# Patient Record
Sex: Female | Born: 1985 | Race: Black or African American | Hispanic: No | Marital: Married | Smoking: Never smoker
Health system: Southern US, Community
[De-identification: ages and names within clinical notes are randomized; demographics above are authoritative.]

## PROBLEM LIST (undated history)

## (undated) DIAGNOSIS — O139 Gestational [pregnancy-induced] hypertension without significant proteinuria, unspecified trimester: Secondary | ICD-10-CM

## (undated) DIAGNOSIS — Z789 Other specified health status: Secondary | ICD-10-CM

## (undated) DIAGNOSIS — N979 Female infertility, unspecified: Secondary | ICD-10-CM

## (undated) HISTORY — PX: NO PAST SURGERIES: SHX2092

## (undated) HISTORY — DX: Gestational (pregnancy-induced) hypertension without significant proteinuria, unspecified trimester: O13.9

---

## 2019-04-09 LAB — OB RESULTS CONSOLE ABO/RH: RH Type: NEGATIVE

## 2019-04-09 LAB — OB RESULTS CONSOLE RUBELLA ANTIBODY, IGM: Rubella: IMMUNE

## 2019-04-09 LAB — OB RESULTS CONSOLE HEPATITIS B SURFACE ANTIGEN: Hepatitis B Surface Ag: NEGATIVE

## 2019-04-09 LAB — OB RESULTS CONSOLE GC/CHLAMYDIA
Chlamydia: NEGATIVE
Gonorrhea: NEGATIVE

## 2019-04-09 LAB — OB RESULTS CONSOLE ANTIBODY SCREEN: Antibody Screen: NEGATIVE

## 2019-04-09 LAB — OB RESULTS CONSOLE RPR: RPR: NONREACTIVE

## 2019-04-09 LAB — OB RESULTS CONSOLE HIV ANTIBODY (ROUTINE TESTING): HIV: NONREACTIVE

## 2019-04-11 NOTE — L&D Delivery Note (Signed)
Delivery Note At 3:15 AM a viable and healthy female was delivered via Vaginal, Spontaneous (Presentation:    Right Occiput Anterior).  APGAR: 2, 9; weight pending .   Placenta status: retained  Cord: 3 vessels with avulsion of the umbilical cord from the body of the placenta with subsequent retained placenta  With excellent maternal expulsive effort the patient brought to the fetal head to crowning.  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  Initially, and midline episiotomy was performed to create additional room for delivery of the head.  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  With this the fetal head delivered.  At the time of delivery of the infant's head a tight nuchal cord was noted and could not be reduced on the perineum.  The umbilical cord was cut and clamped on the perineum and then the infant was delivered and passed to the waiting maternal abdomen.  Immediately after delivery the infant appeared stunned with initial initial heart rate of 40.  Vigorous stimulation was performed.  The infant was quickly passed to the [facility] for additional resuscitation.  Code Apgar was called.  Due to a malfunction in the emergency call system the NICU team was not initially alerted of the need to attend the delivery.  Therefore, the NICU team was called directly.  Please see the NICU note for complete details of the infant's resuscitation.  Gentle traction was placed on the umbilical cord and uterine massage was performed to assist with release of the placenta from the uterus.  However, during this process the umbilical cord avulsed from the body of the placenta.  Continued attempts were made in the delivery room to deliver the placenta, however it became apparent that placental delivery could not be achieved in the delivery room and required transfer to the operating room.  Please see the operative report  for retained placenta for details of this portion of the procedure.  The baby remained in the delivery room with the father and the patient was transferred to the operating room for suction D&C  Anesthesia: Epidural Episiotomy: Median Lacerations: 2nd degree  Est. Blood Loss (mL): 500  Mom to OR.  Baby to Nursery.  Waynard Reeds 10/16/2019, 5:17 AM

## 2019-10-07 LAB — OB RESULTS CONSOLE GBS: GBS: NEGATIVE

## 2019-10-08 ENCOUNTER — Inpatient Hospital Stay (HOSPITAL_COMMUNITY)
Admission: AD | Admit: 2019-10-08 | Discharge: 2019-10-08 | Disposition: A | Discharge status: Home / Self Care | Payer: Medicaid Other | Attending: Obstetrics and Gynecology | Admitting: Obstetrics and Gynecology

## 2019-10-08 ENCOUNTER — Other Ambulatory Visit: Payer: Self-pay

## 2019-10-08 ENCOUNTER — Encounter (HOSPITAL_COMMUNITY): Payer: Self-pay | Admitting: Obstetrics and Gynecology

## 2019-10-08 DIAGNOSIS — Z3A36 36 weeks gestation of pregnancy: Secondary | ICD-10-CM

## 2019-10-08 DIAGNOSIS — O133 Gestational [pregnancy-induced] hypertension without significant proteinuria, third trimester: Secondary | ICD-10-CM | POA: Diagnosis not present

## 2019-10-08 DIAGNOSIS — Z3689 Encounter for other specified antenatal screening: Secondary | ICD-10-CM

## 2019-10-08 HISTORY — DX: Other specified health status: Z78.9

## 2019-10-08 LAB — COMPREHENSIVE METABOLIC PANEL
ALT: 13 U/L (ref 0–44)
AST: 20 U/L (ref 15–41)
Albumin: 2.5 g/dL — ABNORMAL LOW (ref 3.5–5.0)
Alkaline Phosphatase: 124 U/L (ref 38–126)
Anion gap: 9 (ref 5–15)
BUN: 5 mg/dL — ABNORMAL LOW (ref 6–20)
CO2: 19 mmol/L — ABNORMAL LOW (ref 22–32)
Calcium: 9.2 mg/dL (ref 8.9–10.3)
Chloride: 109 mmol/L (ref 98–111)
Creatinine, Ser: 0.75 mg/dL (ref 0.44–1.00)
GFR calc Af Amer: >60 mL/min (ref >60)
GFR calc non Af Amer: >60 mL/min (ref >60)
Glucose, Bld: 130 mg/dL — ABNORMAL HIGH (ref 70–99)
Potassium: 3.7 mmol/L (ref 3.5–5.1)
Sodium: 137 mmol/L (ref 135–145)
Total Bilirubin: 0.4 mg/dL (ref 0.3–1.2)
Total Protein: 6.1 g/dL — ABNORMAL LOW (ref 6.5–8.1)

## 2019-10-08 LAB — URINALYSIS, ROUTINE W REFLEX MICROSCOPIC
Bilirubin Urine: NEGATIVE
Glucose, UA: NEGATIVE mg/dL
Ketones, ur: NEGATIVE mg/dL
Leukocytes,Ua: NEGATIVE
Nitrite: NEGATIVE
Protein, ur: 100 mg/dL — AB
Specific Gravity, Urine: 1.019 (ref 1.005–1.030)
pH: 6 (ref 5.0–8.0)

## 2019-10-08 LAB — CBC
HCT: 33.3 % — ABNORMAL LOW (ref 36.0–46.0)
Hemoglobin: 10.9 g/dL — ABNORMAL LOW (ref 12.0–15.0)
MCH: 27.3 pg (ref 26.0–34.0)
MCHC: 32.7 g/dL (ref 30.0–36.0)
MCV: 83.3 fL (ref 80.0–100.0)
Platelets: 250 10*3/uL (ref 150–400)
RBC: 4 MIL/uL (ref 3.87–5.11)
RDW: 14 % (ref 11.5–15.5)
WBC: 9.9 10*3/uL (ref 4.0–10.5)
nRBC: 0 % (ref 0.0–0.2)

## 2019-10-08 LAB — PROTEIN / CREATININE RATIO, URINE
Creatinine, Urine: 250.27 mg/dL
Protein Creatinine Ratio: 0.25 mg/mg{Cre} — ABNORMAL HIGH (ref 0.00–0.15)
Total Protein, Urine: 62 mg/dL

## 2019-10-08 NOTE — MAU Note (Signed)
Sent here from office for follow up with BP

## 2019-10-08 NOTE — Discharge Instructions (Signed)

## 2019-10-08 NOTE — MAU Provider Note (Signed)
History     CSN: [id number]  Arrival date and time: 10/08/19 1414   First Provider Initiated Contact with Patient 10/08/19 1502      Chief Complaint  Patient presents with  . Hypertension   34 y.o. G1 @36 .2 wks presenting with elevated BP. Pt reports elevated BP yesterday and again today at the office. She went home and checked her BP and was 160/110. Denies HA, visual disturbances, RUQ pain, SOB, and CP. Reports good FM. No c/o.    OB History    Gravida  1   Para      Term      Preterm      AB      Living        SAB      TAB      Ectopic      Multiple      Live Births              Past Medical History:  Diagnosis Date  . Medical history non-contributory     Past Surgical History:  Procedure Laterality Date  . NO PAST SURGERIES      History reviewed. No pertinent family history.  Social History   Tobacco Use  . Smoking status: Not on file  Vaping Use  . Vaping Use: Never used  Substance Use Topics  . Alcohol use: Never  . Drug use: Never    Allergies: Not on File  Medications Prior to Admission  Medication Sig Dispense Refill Last Dose  . Prenatal Vit-Fe Fumarate-FA (PRENATAL MULTIVITAMIN) TABS tablet Take 1 tablet by mouth daily at 12 noon.   10/08/2019 at Unknown time    Review of Systems  Eyes: Negative for visual disturbance.  Gastrointestinal: Negative for abdominal pain.  Neurological: Negative for headaches.   Physical Exam   Blood pressure (!) 129/95, pulse 79, temperature 98.2 F (36.8 C), temperature source Oral, resp. rate 18, height 5\' 6"  (1.676 m), SpO2 100 %. Patient Vitals for the past 24 hrs:  BP Temp Temp src Pulse Resp SpO2 Height  10/08/19 1711 (!) 129/95 98.2 F (36.8 C) Oral 79 18 -- --  10/08/19 1701 (!) 129/95 -- -- 98 -- -- --  10/08/19 1646 (!) 149/108 -- -- 89 -- -- --  10/08/19 1631 138/89 -- -- 77 -- -- --  10/08/19 1616 140/85 -- -- 78 -- -- --  10/08/19 1601 (!) 142/87 -- -- 90 -- -- --   10/08/19 1546 132/86 -- -- 88 -- -- --  10/08/19 1531 (!) 148/94 -- -- 87 -- -- --  10/08/19 1516 (!) 141/91 -- -- 92 -- -- --  10/08/19 1500 (!) 148/93 -- -- 93 -- -- --  10/08/19 1447 (!) 151/96 -- -- 95 -- -- --  10/08/19 1443 -- 98.2 F (36.8 C) Oral 97 19 100 % 5\' 6"  (1.676 m)   Physical Exam Vitals and nursing note reviewed. Exam conducted with a chaperone present.  Constitutional:      General: She is not in acute distress.    Appearance: Normal appearance.  HENT:     Head: Normocephalic and atraumatic.  Pulmonary:     Effort: Pulmonary effort is normal. No respiratory distress.  Musculoskeletal:     Cervical back: Normal range of motion.  Neurological:     General: No focal deficit present.     Mental Status: She is alert and oriented to person, place, and time.  Psychiatric:  Mood and Affect: Mood is anxious.   EFM: 155 bpm, mod variability, + accels, no decels Toco: none  Results for orders placed or performed during the hospital encounter of 10/08/19 (from the past 24 hour(s))  Urinalysis, Routine w reflex microscopic     Status: Abnormal   Collection Time: 10/08/19  3:00 PM  Result Value Ref Range   Color, Urine YELLOW YELLOW   APPearance HAZY (A) CLEAR   Specific Gravity, Urine 1.019 1.005 - 1.030   pH 6.0 5.0 - 8.0   Glucose, UA NEGATIVE NEGATIVE mg/dL   Hgb urine dipstick SMALL (A) NEGATIVE   Bilirubin Urine NEGATIVE NEGATIVE   Ketones, ur NEGATIVE NEGATIVE mg/dL   Protein, ur 282 (A) NEGATIVE mg/dL   Nitrite NEGATIVE NEGATIVE   Leukocytes,Ua NEGATIVE NEGATIVE   RBC / HPF 0-5 0 - 5 RBC/hpf   WBC, UA 0-5 0 - 5 WBC/hpf   Bacteria, UA FEW (A) NONE SEEN   Squamous Epithelial / LPF 0-5 0 - 5   Mucus PRESENT   Protein / creatinine ratio, urine     Status: Abnormal   Collection Time: 10/08/19  3:01 PM  Result Value Ref Range   Creatinine, Urine 250.27 mg/dL   Total Protein, Urine 62 mg/dL   Protein Creatinine Ratio 0.25 (H) 0.00 - 0.15 mg/mg[Cre]   CBC     Status: Abnormal   Collection Time: 10/08/19  3:07 PM  Result Value Ref Range   WBC 9.9 4.0 - 10.5 K/uL   RBC 4.00 3.87 - 5.11 MIL/uL   Hemoglobin 10.9 (L) 12.0 - 15.0 g/dL   HCT 06.0 (L) 36 - 46 %   MCV 83.3 80.0 - 100.0 fL   MCH 27.3 26.0 - 34.0 pg   MCHC 32.7 30.0 - 36.0 g/dL   RDW 15.6 15.3 - 79.4 %   Platelets 250 150 - 400 K/uL   nRBC 0.0 0.0 - 0.2 %  Comprehensive metabolic panel     Status: Abnormal   Collection Time: 10/08/19  3:07 PM  Result Value Ref Range   Sodium 137 135 - 145 mmol/L   Potassium 3.7 3.5 - 5.1 mmol/L   Chloride 109 98 - 111 mmol/L   CO2 19 (L) 22 - 32 mmol/L   Glucose, Bld 130 (H) 70 - 99 mg/dL   BUN 5 (L) 6 - 20 mg/dL   Creatinine, Ser 3.27 0.44 - 1.00 mg/dL   Calcium 9.2 8.9 - 61.4 mg/dL   Total Protein 6.1 (L) 6.5 - 8.1 g/dL   Albumin 2.5 (L) 3.5 - 5.0 g/dL   AST 20 15 - 41 U/L   ALT 13 0 - 44 U/L   Alkaline Phosphatase 124 38 - 126 U/L   Total Bilirubin 0.4 0.3 - 1.2 mg/dL   GFR calc non Af Amer >60 >60 mL/min   GFR calc Af Amer >60 >60 mL/min   Anion gap 9 5 - 15   MAU Course  Procedures  MDM Labs ordered and reviewed. PN records requested and received. 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 No evidence of PEC. Pt reports f/u appt in office in 2 days. 1640: Discussed presentation and clinical findings with Dr. Claiborne Billings and she confirms office f/u in 2 days. Pt is stable for discharge home.   Assessment and Plan   1. [redacted] weeks gestation of pregnancy   2. NST (non-stress test) reactive   3. Gestational hypertension, third trimester    Discharge home  Follow up at [facility] in 2 days Strict PEC precautions  Allergies as of 10/08/2019   Not on File     Medication List    TAKE these medications   prenatal multivitamin Tabs tablet Take 1 tablet by mouth daily at 12 noon.      Donette Larry, CNM 10/08/2019, 5:16 PM

## 2019-10-08 NOTE — MAU Note (Signed)
Office called and requested prenatal record to be sent

## 2019-10-09 ENCOUNTER — Other Ambulatory Visit: Payer: Self-pay | Admitting: Obstetrics & Gynecology

## 2019-10-10 ENCOUNTER — Other Ambulatory Visit: Payer: Self-pay

## 2019-10-10 ENCOUNTER — Encounter (HOSPITAL_COMMUNITY): Payer: Self-pay | Admitting: Obstetrics

## 2019-10-10 ENCOUNTER — Inpatient Hospital Stay (HOSPITAL_COMMUNITY)
Admission: AD | Admit: 2019-10-10 | Discharge: 2019-10-10 | Disposition: A | Discharge status: Home / Self Care | Payer: Medicaid Other | Source: Ambulatory Visit | Attending: Obstetrics | Admitting: Obstetrics

## 2019-10-10 ENCOUNTER — Telehealth (HOSPITAL_COMMUNITY): Payer: Self-pay | Admitting: *Deleted

## 2019-10-10 DIAGNOSIS — Z3A36 36 weeks gestation of pregnancy: Secondary | ICD-10-CM | POA: Diagnosis not present

## 2019-10-10 DIAGNOSIS — O99213 Obesity complicating pregnancy, third trimester: Secondary | ICD-10-CM | POA: Diagnosis not present

## 2019-10-10 DIAGNOSIS — O133 Gestational [pregnancy-induced] hypertension without significant proteinuria, third trimester: Secondary | ICD-10-CM

## 2019-10-10 DIAGNOSIS — E669 Obesity, unspecified: Secondary | ICD-10-CM | POA: Diagnosis not present

## 2019-10-10 DIAGNOSIS — Z79899 Other long term (current) drug therapy: Secondary | ICD-10-CM | POA: Diagnosis not present

## 2019-10-10 DIAGNOSIS — Z7982 Long term (current) use of aspirin: Secondary | ICD-10-CM | POA: Diagnosis not present

## 2019-10-10 LAB — COMPREHENSIVE METABOLIC PANEL
ALT: 13 U/L (ref 0–44)
AST: 19 U/L (ref 15–41)
Albumin: 2.4 g/dL — ABNORMAL LOW (ref 3.5–5.0)
Alkaline Phosphatase: 131 U/L — ABNORMAL HIGH (ref 38–126)
Anion gap: 8 (ref 5–15)
BUN: 5 mg/dL — ABNORMAL LOW (ref 6–20)
CO2: 20 mmol/L — ABNORMAL LOW (ref 22–32)
Calcium: 9 mg/dL (ref 8.9–10.3)
Chloride: 109 mmol/L (ref 98–111)
Creatinine, Ser: 0.65 mg/dL (ref 0.44–1.00)
GFR calc Af Amer: >60 mL/min (ref >60)
GFR calc non Af Amer: >60 mL/min (ref >60)
Glucose, Bld: 128 mg/dL — ABNORMAL HIGH (ref 70–99)
Potassium: 3.6 mmol/L (ref 3.5–5.1)
Sodium: 137 mmol/L (ref 135–145)
Total Bilirubin: 0.5 mg/dL (ref 0.3–1.2)
Total Protein: 6 g/dL — ABNORMAL LOW (ref 6.5–8.1)

## 2019-10-10 LAB — URINALYSIS, ROUTINE W REFLEX MICROSCOPIC
Bilirubin Urine: NEGATIVE
Glucose, UA: NEGATIVE mg/dL
Hgb urine dipstick: NEGATIVE
Ketones, ur: NEGATIVE mg/dL
Leukocytes,Ua: NEGATIVE
Nitrite: NEGATIVE
Protein, ur: 100 mg/dL — AB
Specific Gravity, Urine: 1.023 (ref 1.005–1.030)
pH: 6 (ref 5.0–8.0)

## 2019-10-10 LAB — CBC
HCT: 33.8 % — ABNORMAL LOW (ref 36.0–46.0)
Hemoglobin: 10.8 g/dL — ABNORMAL LOW (ref 12.0–15.0)
MCH: 26.8 pg (ref 26.0–34.0)
MCHC: 32 g/dL (ref 30.0–36.0)
MCV: 83.9 fL (ref 80.0–100.0)
Platelets: 240 10*3/uL (ref 150–400)
RBC: 4.03 MIL/uL (ref 3.87–5.11)
RDW: 14.2 % (ref 11.5–15.5)
WBC: 8.7 10*3/uL (ref 4.0–10.5)
nRBC: 0 % (ref 0.0–0.2)

## 2019-10-10 LAB — PROTEIN / CREATININE RATIO, URINE
Creatinine, Urine: 320.68 mg/dL
Protein Creatinine Ratio: 0.17 mg/mg{Cre} — ABNORMAL HIGH (ref 0.00–0.15)
Total Protein, Urine: 53 mg/dL

## 2019-10-10 NOTE — Discharge Instructions (Signed)
Hypertension During Pregnancy Hypertension is also called high blood pressure. High blood pressure means that the force of your blood moving in your body is too strong. It can cause problems for you and your baby. Different types of high blood pressure can happen during pregnancy. The types are:  High blood pressure before you got pregnant. This is called chronic hypertension.  This can continue during your pregnancy. Your doctor will want to keep checking your blood pressure. You may need medicine to keep your blood pressure under control while you are pregnant. You will need follow-up visits after you have your baby.  High blood pressure that goes up during pregnancy when it was normal before. This is called gestational hypertension. It will usually get better after you have your baby, but your doctor will need to watch your blood pressure to make sure that it is getting better.  Very high blood pressure during pregnancy. This is called preeclampsia. Very high blood pressure is an emergency that needs to be checked and treated right away.  You may develop very high blood pressure after giving birth. This is called postpartum preeclampsia. This usually occurs within 48 hours after childbirth but may occur up to 6 weeks after giving birth. This is rare. How does this affect me? If you have high blood pressure during pregnancy, you have a higher chance of developing high blood pressure:  As you get older.  If you get pregnant again. In some cases, high blood pressure during pregnancy can cause:  Stroke.  Heart attack.  Damage to the kidneys, lungs, or liver.  Preeclampsia.  Jerky movements you cannot control (convulsions or seizures).  Problems with the placenta. How does this affect my baby? Your baby may:  Be born early.  Not weigh as much as he or she should.  Not handle labor well, leading to a c-section birth. What are the risks?  Having high blood pressure during a past  pregnancy.  Being overweight.  Being 35 years old or older.  Being pregnant for the first time.  Being pregnant with more than one baby.  Becoming pregnant using fertility methods, such as IVF.  Having other problems, such as diabetes, or kidney disease.  Having family members who have high blood pressure. What can I do to lower my risk?   Keep a healthy weight.  Eat a healthy diet.  Follow what your doctor tells you about treating any medical problems that you had before becoming pregnant. It is very important to go to all of your doctor visits. Your doctor will check your blood pressure and make sure that your pregnancy is progressing as it should. Treatment should start early if a problem is found. How is this treated? Treatment for high blood pressure during pregnancy can differ depending on the type of high blood pressure you have and how serious it is.  You may need to take blood pressure medicine.  If you have been taking medicine for your blood pressure, you may need to change the medicine during pregnancy if it is not safe for your baby.  If your doctor thinks that you could get very high blood pressure, he or she may tell you to take a low-dose aspirin during your pregnancy.  If you have very high blood pressure, you may need to stay in the hospital so you and your baby can be watched closely. You may also need to take medicine to lower your blood pressure. This medicine may be given by mouth   or through an IV tube.  In some cases, if your condition gets worse, you may need to have your baby early. Follow these instructions at home: Eating and drinking   Drink enough fluid to keep your pee (urine) pale yellow.  Avoid caffeine. Lifestyle  Do not use any products that contain nicotine or tobacco, such as cigarettes, e-cigarettes, and chewing tobacco. If you need help quitting, ask your doctor.  Do not use alcohol or drugs.  Avoid stress.  Rest and get plenty  of sleep.  Regular exercise can help. Ask your doctor what kinds of exercise are best for you. General instructions  Take over-the-counter and prescription medicines only as told by your doctor.  Keep all prenatal and follow-up visits as told by your doctor. This is important. Contact a doctor if:  You have symptoms that your doctor told you to watch for, such as: ? Headaches. ? Nausea. ? Vomiting. ? Belly (abdominal) pain. ? Dizziness. ? Light-headedness. Get help right away if:  You have: ? Very bad belly pain that does not get better with treatment. ? A very bad headache that does not get better. ? Vomiting that does not get better. ? Sudden, fast weight gain. ? Sudden swelling in your hands, ankles, or face. ? Bleeding from your vagina. ? Blood in your pee. ? Blurry vision. ? Double vision. ? Shortness of breath. ? Chest pain. ? Weakness on one side of your body. ? Trouble talking.  Your baby is not moving as much as usual. Summary  High blood pressure is also called hypertension.  High blood pressure means that the force of your blood moving in your body is too strong.  High blood pressure can cause problems for you and your baby.  Keep all follow-up visits as told by your doctor. This is important. This information is not intended to replace advice given to you by your health care provider. Make sure you discuss any questions you have with your health care provider. Document Revised: 07/18/2018 Document Reviewed: 04/23/2018 Elsevier Patient Education  2020 [employer].  Labor Induction  Labor induction is when steps are taken to cause a pregnant woman to begin the labor process. Most women go into labor on their own between 37 weeks and 42 weeks of pregnancy. When this does not happen or when there is a medical need for labor to begin, steps may be taken to induce labor. Labor induction causes a pregnant woman's uterus to contract. It also causes the cervix  to soften (ripen), open (dilate), and thin out (efface). Usually, labor is not induced before 39 weeks of pregnancy unless there is a medical reason to do so. Your health care provider will determine if labor induction is needed. Before inducing labor, your health care provider will consider a number of factors, including:  Your medical condition and your baby's.  How many weeks along you are in your pregnancy.  How mature your baby's lungs are.  The condition of your cervix.  The position of your baby.  The size of your birth canal. What are some reasons for labor induction? Labor may be induced if:  Your health or your baby's health is at risk.  Your pregnancy is overdue by 1 week or more.  Your water breaks but labor does not start on its own.  There is a low amount of amniotic fluid around your baby. You may also choose (elect) to have labor induced at a certain time. Generally, elective labor induction  is done no earlier than 39 weeks of pregnancy. What methods are used for labor induction? Methods used for labor induction include:  Prostaglandin medicine. This medicine starts contractions and causes the cervix to dilate and ripen. It can be taken by mouth (orally) or by being inserted into the vagina (suppository).  Inserting a small, thin tube (catheter) with a balloon into the vagina and then expanding the balloon with water to dilate the cervix.  Stripping the membranes. In this method, your health care provider gently separates amniotic sac tissue from the cervix. This causes the cervix to stretch, which in turn causes the release of a hormone called progesterone. The hormone causes the uterus to contract. This procedure is often done during an office visit, after which you will be sent home to wait for contractions to begin.  Breaking the water. In this method, your health care provider uses a small instrument to make a small hole in the amniotic sac. This eventually  causes the amniotic sac to break. Contractions should begin after a few hours.  Medicine to trigger or strengthen contractions. This medicine is given through an IV that is inserted into a vein in your arm. Except for membrane stripping, which can be done in a clinic, labor induction is done in the hospital so that you and your baby can be carefully monitored. How long does it take for labor to be induced? The length of time it takes to induce labor depends on how ready your body is for labor. Some inductions can take up to 2-3 days, while others may take less than a day. Induction may take longer if:  You are induced early in your pregnancy.  It is your first pregnancy.  Your cervix is not ready. What are some risks associated with labor induction? Some risks associated with labor induction include:  Changes in fetal heart rate, such as being too high, too low, or irregular (erratic).  Failed induction.  Infection in the mother or the baby.  Increased risk of having a cesarean delivery.  Fetal death.  Breaking off (abruption) of the placenta from the uterus (rare).  Rupture of the uterus (very rare). When induction is needed for medical reasons, the benefits of induction generally outweigh the risks. What are some reasons for not inducing labor? Labor induction should not be done if:  Your baby does not tolerate contractions.  You have had previous surgeries on your uterus, such as a myomectomy, removal of fibroids, or a vertical scar from a previous cesarean delivery.  Your placenta lies very low in your uterus and blocks the opening of the cervix (placenta previa).  Your baby is not in a head-down position.  The umbilical cord drops down into the birth canal in front of the baby.  There are unusual circumstances, such as the baby being very early (premature).  You have had more than 2 previous cesarean deliveries. Summary  Labor induction is when steps are taken to  cause a pregnant woman to begin the labor process.  Labor induction causes a pregnant woman's uterus to contract. It also causes the cervix to ripen, dilate, and efface.  Labor is not induced before 39 weeks of pregnancy unless there is a medical reason to do so.  When induction is needed for medical reasons, the benefits of induction generally outweigh the risks. This information is not intended to replace advice given to you by your health care provider. Make sure you discuss any questions you have with your  health care provider. Document Revised: 03/30/2017 Document Reviewed: 05/10/2016 Elsevier Patient Education  2020 [employer].

## 2019-10-10 NOTE — Telephone Encounter (Signed)
Preadmission screen  

## 2019-10-10 NOTE — MAU Note (Signed)
Discharge Instructions     Hypertension During Pregnancy Hypertension is also called high blood pressure. High blood pressure means that the force of your blood moving in your body is too strong. It can cause problems for you and your baby. Different types of high blood pressure can happen during pregnancy. The types are:  High blood pressure before you got pregnant. This is called chronic hypertension.  This can continue during your pregnancy. Your doctor will want to keep checking your blood pressure. You may need medicine to keep your blood pressure under control while you are pregnant. You will need follow-up visits after you have your baby.  High blood pressure that goes up during pregnancy when it was normal before. This is called gestational hypertension. It will usually get better after you have your baby, but your doctor will need to watch your blood pressure to make sure that it is getting better.  Very high blood pressure during pregnancy. This is called preeclampsia. Very high blood pressure is an emergency that needs to be checked and treated right away.  You may develop very high blood pressure after giving birth. This is called postpartum preeclampsia. This usually occurs within 48 hours after childbirth but may occur up to 6 weeks after giving birth. This is rare. How does this affect me? If you have high blood pressure during pregnancy, you have a higher chance of developing high blood pressure:  As you get older.  If you get pregnant again. In some cases, high blood pressure during pregnancy can cause:  Stroke.  Heart attack.  Damage to the kidneys, lungs, or liver.  Preeclampsia.  Jerky movements you cannot control (convulsions or seizures).  Problems with the placenta. How does this affect my baby? Your baby may:  Be born early.  Not weigh as much as he or she should.  Not handle labor well, leading to a c-section birth. What are the risks?  Having  high blood pressure during a past pregnancy.  Being overweight.  Being 46 years old or older.  Being pregnant for the first time.  Being pregnant with more than one baby.  Becoming pregnant using fertility methods, such as IVF.  Having other problems, such as diabetes, or kidney disease.  Having family members who have high blood pressure. What can I do to lower my risk?   Keep a healthy weight.  Eat a healthy diet.  Follow what your doctor tells you about treating any medical problems that you had before becoming pregnant. It is very important to go to all of your doctor visits. Your doctor will check your blood pressure and make sure that your pregnancy is progressing as it should. Treatment should start early if a problem is found. How is this treated? Treatment for high blood pressure during pregnancy can differ depending on the type of high blood pressure you have and how serious it is.  You may need to take blood pressure medicine.  If you have been taking medicine for your blood pressure, you may need to change the medicine during pregnancy if it is not safe for your baby.  If your doctor thinks that you could get very high blood pressure, he or she may tell you to take a low-dose aspirin during your pregnancy.  If you have very high blood pressure, you may need to stay in the hospital so you and your baby can be watched closely. You may also need to take medicine to lower your blood pressure.  This medicine may be given by mouth or through an IV tube.  In some cases, if your condition gets worse, you may need to have your baby early. Follow these instructions at home: Eating and drinking   Drink enough fluid to keep your pee (urine) pale yellow.  Avoid caffeine. Lifestyle  Do not use any products that contain nicotine or tobacco, such as cigarettes, e-cigarettes, and chewing tobacco. If you need help quitting, ask your doctor.  Do not use alcohol or  drugs.  Avoid stress.  Rest and get plenty of sleep.  Regular exercise can help. Ask your doctor what kinds of exercise are best for you. General instructions  Take over-the-counter and prescription medicines only as told by your doctor.  Keep all prenatal and follow-up visits as told by your doctor. This is important. Contact a doctor if:  You have symptoms that your doctor told you to watch for, such as: ? Headaches. ? Nausea. ? Vomiting. ? Belly (abdominal) pain. ? Dizziness. ? Light-headedness. Get help right away if:  You have: ? Very bad belly pain that does not get better with treatment. ? A very bad headache that does not get better. ? Vomiting that does not get better. ? Sudden, fast weight gain. ? Sudden swelling in your hands, ankles, or face. ? Bleeding from your vagina. ? Blood in your pee. ? Blurry vision. ? Double vision. ? Shortness of breath. ? Chest pain. ? Weakness on one side of your body. ? Trouble talking.  Your baby is not moving as much as usual. Summary  High blood pressure is also called hypertension.  High blood pressure means that the force of your blood moving in your body is too strong.  High blood pressure can cause problems for you and your baby.  Keep all follow-up visits as told by your doctor. This is important. This information is not intended to replace advice given to you by your health care provider. Make sure you discuss any questions you have with your health care provider. Document Revised: 07/18/2018 Document Reviewed: 04/23/2018 Elsevier Patient Education  2020 [employer].  Labor Induction  Labor induction is when steps are taken to cause a pregnant woman to begin the labor process. Most women go into labor on their own between 37 weeks and 42 weeks of pregnancy. When this does not happen or when there is a medical need for labor to begin, steps may be taken to induce labor. Labor induction causes a pregnant woman's  uterus to contract. It also causes the cervix to soften (ripen), open (dilate), and thin out (efface). Usually, labor is not induced before 39 weeks of pregnancy unless there is a medical reason to do so. Your health care provider will determine if labor induction is needed. Before inducing labor, your health care provider will consider a number of factors, including:  Your medical condition and your baby's.  How many weeks along you are in your pregnancy.  How mature your baby's lungs are.  The condition of your cervix.  The position of your baby.  The size of your birth canal. What are some reasons for labor induction? Labor may be induced if:  Your health or your baby's health is at risk.  Your pregnancy is overdue by 1 week or more.  Your water breaks but labor does not start on its own.  There is a low amount of amniotic fluid around your baby. You may also choose (elect) to have labor induced at  a certain time. Generally, elective labor induction is done no earlier than 39 weeks of pregnancy. What methods are used for labor induction? Methods used for labor induction include:  Prostaglandin medicine. This medicine starts contractions and causes the cervix to dilate and ripen. It can be taken by mouth (orally) or by being inserted into the vagina (suppository).  Inserting a small, thin tube (catheter) with a balloon into the vagina and then expanding the balloon with water to dilate the cervix.  Stripping the membranes. In this method, your health care provider gently separates amniotic sac tissue from the cervix. This causes the cervix to stretch, which in turn causes the release of a hormone called progesterone. The hormone causes the uterus to contract. This procedure is often done during an office visit, after which you will be sent home to wait for contractions to begin.  Breaking the water. In this method, your health care provider uses a small instrument to make a small  hole in the amniotic sac. This eventually causes the amniotic sac to break. Contractions should begin after a few hours.  Medicine to trigger or strengthen contractions. This medicine is given through an IV that is inserted into a vein in your arm. Except for membrane stripping, which can be done in a clinic, labor induction is done in the hospital so that you and your baby can be carefully monitored. How long does it take for labor to be induced? The length of time it takes to induce labor depends on how ready your body is for labor. Some inductions can take up to 2-3 days, while others may take less than a day. Induction may take longer if:  You are induced early in your pregnancy.  It is your first pregnancy.  Your cervix is not ready. What are some risks associated with labor induction? Some risks associated with labor induction include:  Changes in fetal heart rate, such as being too high, too low, or irregular (erratic).  Failed induction.  Infection in the mother or the baby.  Increased risk of having a cesarean delivery.  Fetal death.  Breaking off (abruption) of the placenta from the uterus (rare).  Rupture of the uterus (very rare). When induction is needed for medical reasons, the benefits of induction generally outweigh the risks. What are some reasons for not inducing labor? Labor induction should not be done if:  Your baby does not tolerate contractions.  You have had previous surgeries on your uterus, such as a myomectomy, removal of fibroids, or a vertical scar from a previous cesarean delivery.  Your placenta lies very low in your uterus and blocks the opening of the cervix (placenta previa).  Your baby is not in a head-down position.  The umbilical cord drops down into the birth canal in front of the baby.  There are unusual circumstances, such as the baby being very early (premature).  You have had more than 2 previous cesarean  deliveries. Summary  Labor induction is when steps are taken to cause a pregnant woman to begin the labor process.  Labor induction causes a pregnant woman's uterus to contract. It also causes the cervix to ripen, dilate, and efface.  Labor is not induced before 39 weeks of pregnancy unless there is a medical reason to do so.  When induction is needed for medical reasons, the benefits of induction generally outweigh the risks. This information is not intended to replace advice given to you by your health care provider. Make sure you  discuss any questions you have with your health care provider. Document Revised: 03/30/2017 Document Reviewed: 05/10/2016 Elsevier Patient Education  2020 [employer]     Patient discharged regarding POC and follow up for IOL on Tuesday 7/6 in the AM.  Patient and family instructed about s/s of preeclampsia and verbalized understanding.  RN also educated patient to return if she has bleeding, leaking of fluid or decreased fetal movement.  Interpreter utilized throughout Psychologist, sport and exerciseconversation.

## 2019-10-10 NOTE — MAU Provider Note (Signed)
Chief Complaint:  Hypertension   First Provider Initiated Contact with Patient 10/10/19 1402     HPI: Margaret Schmidt is a 34 y.o. G1P0 at [redacted]w[redacted]d who presents to maternity admissions after elevated BP at her prenatal appointment. She denies HA, dizziness/blurred vision, or epigastric pain. She endorses swelling in hands/feet. Denies contractions, vaginal bleeding, leaking of fluid, decreased fetal movement, fever, falls, or recent illness.     Pregnancy Course: Complicated by obesity and gestational hypertension   Past Medical History:  Diagnosis Date  . Medical history non-contributory    OB History  Gravida Para Term Preterm AB Living  1            SAB TAB Ectopic Multiple Live Births               # Outcome Date GA Lbr Len/2nd Weight Sex Delivery Anes PTL Lv  1 Current            Past Surgical History:  Procedure Laterality Date  . NO PAST SURGERIES     History reviewed. No pertinent family history. Social History   Tobacco Use  . Smoking status: Never Smoker  . Smokeless tobacco: Never Used  Vaping Use  . Vaping Use: Never used  Substance Use Topics  . Alcohol use: Never  . Drug use: Never   No Known Allergies Medications Prior to Admission  Medication Sig Dispense Refill Last Dose  . aspirin 81 MG chewable tablet Chew by mouth daily.   10/09/2019 at Unknown time  . Prenatal Vit-Fe Fumarate-FA (PRENATAL MULTIVITAMIN) TABS tablet Take 1 tablet by mouth daily at 12 noon.   10/09/2019 at Unknown time    I have reviewed patient's Past Medical Hx, Surgical Hx, Family Hx, Social Hx, medications and allergies.   ROS:  Review of Systems  Constitutional: Negative for diaphoresis, fatigue and fever.  HENT: Negative.   Eyes: Negative.   Respiratory: Negative for cough, chest tightness and shortness of breath.   Cardiovascular: Negative for chest pain and palpitations.  Gastrointestinal: Positive for nausea and vomiting (still feels nauseated and vomits every morning).  Negative for abdominal distention, abdominal pain, constipation and diarrhea.  Endocrine: Negative.   Genitourinary: Negative for pelvic pain, vaginal bleeding, vaginal discharge and vaginal pain.  Musculoskeletal: Negative.   Skin: Negative.   Allergic/Immunologic: Negative.   Neurological: Negative for dizziness, light-headedness and headaches.  Hematological: Negative.   Psychiatric/Behavioral: Negative.     Physical Exam   Patient Vitals for the past 24 hrs:  BP Temp Temp src Pulse Resp SpO2 Height Weight  10/10/19 1445 (!) 145/95 -- -- 76 -- 100 % -- --  10/10/19 1430 (!) 147/94 -- -- 74 -- 99 % -- --  10/10/19 1415 (!) 145/95 -- -- 80 -- -- -- --  10/10/19 1400 (!) 147/93 -- -- 79 -- -- -- --  10/10/19 1354 (!) 150/92 98.7 F (37.1 C) Oral 83 -- 99 % -- --  10/10/19 1327 (!) 148/96 -- Oral 90 18 99 % 5\' 6"  (1.676 m) 251 lb 6.4 oz (114 kg)    Constitutional: Well-developed, well-nourished female in no acute distress.  Cardiovascular: normal rate & rhythm, no murmur Respiratory: normal effort, lung sounds clear throughout GI: Abd soft, non-tender, gravid appropriate for gestational age. Pos BS x 4 MS: Extremities nontender, no edema, normal ROM Neurologic: Alert and oriented x 4.  Pelvic: Deferred  Fetal Tracing: reactive Baseline: 140 Variability: moderate Accelerations: + Decelerations: none Toco: none   Labs: Results for  orders placed or performed during the hospital encounter of 10/10/19 (from the past 24 hour(s))  CBC     Status: Abnormal   Collection Time: 10/10/19  1:14 PM  Result Value Ref Range   WBC 8.7 4.0 - 10.5 K/uL   RBC 4.03 3.87 - 5.11 MIL/uL   Hemoglobin 10.8 (L) 12.0 - 15.0 g/dL   HCT 78.2 (L) 36 - 46 %   MCV 83.9 80.0 - 100.0 fL   MCH 26.8 26.0 - 34.0 pg   MCHC 32.0 30.0 - 36.0 g/dL   RDW 95.6 21.3 - 08.6 %   Platelets 240 150 - 400 K/uL   nRBC 0.0 0.0 - 0.2 %  Comprehensive metabolic panel     Status: Abnormal   Collection Time: 10/10/19   1:14 PM  Result Value Ref Range   Sodium 137 135 - 145 mmol/L   Potassium 3.6 3.5 - 5.1 mmol/L   Chloride 109 98 - 111 mmol/L   CO2 20 (L) 22 - 32 mmol/L   Glucose, Bld 128 (H) 70 - 99 mg/dL   BUN 5 (L) 6 - 20 mg/dL   Creatinine, Ser 5.78 0.44 - 1.00 mg/dL   Calcium 9.0 8.9 - 46.9 mg/dL   Total Protein 6.0 (L) 6.5 - 8.1 g/dL   Albumin 2.4 (L) 3.5 - 5.0 g/dL   AST 19 15 - 41 U/L   ALT 13 0 - 44 U/L   Alkaline Phosphatase 131 (H) 38 - 126 U/L   Total Bilirubin 0.5 0.3 - 1.2 mg/dL   GFR calc non Af Amer >60 >60 mL/min   GFR calc Af Amer >60 >60 mL/min   Anion gap 8 5 - 15  Protein / creatinine ratio, urine     Status: Abnormal   Collection Time: 10/10/19  1:43 PM  Result Value Ref Range   Creatinine, Urine 320.68 mg/dL   Total Protein, Urine 53 mg/dL   Protein Creatinine Ratio 0.17 (H) 0.00 - 0.15 mg/mg[Cre]  Urinalysis, Routine w reflex microscopic     Status: Abnormal   Collection Time: 10/10/19  2:06 PM  Result Value Ref Range   Color, Urine AMBER (A) YELLOW   APPearance HAZY (A) CLEAR   Specific Gravity, Urine 1.023 1.005 - 1.030   pH 6.0 5.0 - 8.0   Glucose, UA NEGATIVE NEGATIVE mg/dL   Hgb urine dipstick NEGATIVE NEGATIVE   Bilirubin Urine NEGATIVE NEGATIVE   Ketones, ur NEGATIVE NEGATIVE mg/dL   Protein, ur 629 (A) NEGATIVE mg/dL   Nitrite NEGATIVE NEGATIVE   Leukocytes,Ua NEGATIVE NEGATIVE   RBC / HPF 0-5 0 - 5 RBC/hpf   WBC, UA 0-5 0 - 5 WBC/hpf   Bacteria, UA FEW (A) NONE SEEN   Squamous Epithelial / LPF 6-10 0 - 5   Mucus PRESENT     Imaging:  None   MAU Course: Orders Placed This Encounter  Procedures  . CBC  . Comprehensive metabolic panel  . Protein / creatinine ratio, urine  . Urinalysis, Routine w reflex microscopic  . Discharge patient    MDM: CBC - WNL CMP - liver enzymes WNL Pr/Cr - 0.17 down from 6/30  Discussed clinical findings with Dr. Chestine Spore, agreed to discharge - induction scheduled for Tuesday, July 6.  Assessment: 1.  Gestational hypertension, third trimester    Plan: Discharge home in stable condition.  Preeclampsia precautions given   Follow-up Information    Ob/Gyn, Nestor Ramp. Go to.   Why: Induction scheduled for Tuesday, July  6 - you will get a call on Tuesday morning and told when to come in. Contact information: [address] [place] [address] [phone]               Allergies as of 10/10/2019   No Known Allergies     Medication List    TAKE these medications   aspirin 81 MG chewable tablet Chew by mouth daily.   prenatal multivitamin Tabs tablet Take 1 tablet by mouth daily at 12 noon.       Bernerd Limbo, CNM 10/10/2019 3:01 PM

## 2019-10-10 NOTE — MAU Note (Signed)
Pt sent from office for BP check. Was here 10/08/2019 for same problem. Denies VB, contractions or LOF, +FM   Denies headache, vision changes, RUq pain.

## 2019-10-11 ENCOUNTER — Other Ambulatory Visit (HOSPITAL_COMMUNITY)
Admission: RE | Admit: 2019-10-11 | Discharge: 2019-10-11 | Disposition: A | Discharge status: Home / Self Care | Payer: Medicaid Other | Source: Ambulatory Visit | Attending: Obstetrics & Gynecology | Admitting: Obstetrics & Gynecology

## 2019-10-11 DIAGNOSIS — Z01812 Encounter for preprocedural laboratory examination: Secondary | ICD-10-CM | POA: Insufficient documentation

## 2019-10-11 DIAGNOSIS — Z20822 Contact with and (suspected) exposure to covid-19: Secondary | ICD-10-CM | POA: Diagnosis not present

## 2019-10-11 LAB — SARS CORONAVIRUS 2 (TAT 6-24 HRS): SARS Coronavirus 2: NEGATIVE

## 2019-10-14 ENCOUNTER — Inpatient Hospital Stay (HOSPITAL_COMMUNITY)
Admission: AD | Admit: 2019-10-14 | Discharge: 2019-10-19 | DRG: 798 | Disposition: A | Discharge status: Home / Self Care | Payer: Medicaid Other | Attending: Obstetrics and Gynecology | Admitting: Obstetrics and Gynecology

## 2019-10-14 ENCOUNTER — Encounter (HOSPITAL_COMMUNITY): Payer: Self-pay | Admitting: Obstetrics & Gynecology

## 2019-10-14 ENCOUNTER — Inpatient Hospital Stay (HOSPITAL_COMMUNITY): Payer: Medicaid Other | Admitting: Anesthesiology

## 2019-10-14 ENCOUNTER — Inpatient Hospital Stay (HOSPITAL_COMMUNITY): Payer: Medicaid Other

## 2019-10-14 ENCOUNTER — Other Ambulatory Visit: Payer: Self-pay

## 2019-10-14 DIAGNOSIS — N90810 Female genital mutilation status, unspecified: Secondary | ICD-10-CM | POA: Diagnosis present

## 2019-10-14 DIAGNOSIS — O26893 Other specified pregnancy related conditions, third trimester: Secondary | ICD-10-CM | POA: Diagnosis present

## 2019-10-14 DIAGNOSIS — O3483 Maternal care for other abnormalities of pelvic organs, third trimester: Secondary | ICD-10-CM | POA: Diagnosis present

## 2019-10-14 DIAGNOSIS — Z3A37 37 weeks gestation of pregnancy: Secondary | ICD-10-CM

## 2019-10-14 DIAGNOSIS — Z6791 Unspecified blood type, Rh negative: Secondary | ICD-10-CM | POA: Diagnosis not present

## 2019-10-14 DIAGNOSIS — O1414 Severe pre-eclampsia complicating childbirth: Secondary | ICD-10-CM | POA: Diagnosis present

## 2019-10-14 DIAGNOSIS — O1490 Unspecified pre-eclampsia, unspecified trimester: Secondary | ICD-10-CM | POA: Diagnosis present

## 2019-10-14 DIAGNOSIS — O99214 Obesity complicating childbirth: Secondary | ICD-10-CM | POA: Diagnosis present

## 2019-10-14 DIAGNOSIS — O134 Gestational [pregnancy-induced] hypertension without significant proteinuria, complicating childbirth: Secondary | ICD-10-CM | POA: Diagnosis present

## 2019-10-14 LAB — CBC
HCT: 33.7 % — ABNORMAL LOW (ref 36.0–46.0)
HCT: 33.3 % — ABNORMAL LOW (ref 36.0–46.0)
Hemoglobin: 11 g/dL — ABNORMAL LOW (ref 12.0–15.0)
Hemoglobin: 10.6 g/dL — ABNORMAL LOW (ref 12.0–15.0)
MCH: 27.6 pg (ref 26.0–34.0)
MCH: 26.8 pg (ref 26.0–34.0)
MCHC: 32.6 g/dL (ref 30.0–36.0)
MCHC: 31.8 g/dL (ref 30.0–36.0)
MCV: 84.7 fL (ref 80.0–100.0)
MCV: 84.1 fL (ref 80.0–100.0)
Platelets: 253 10*3/uL (ref 150–400)
Platelets: 237 10*3/uL (ref 150–400)
RBC: 3.98 MIL/uL (ref 3.87–5.11)
RBC: 3.96 MIL/uL (ref 3.87–5.11)
RDW: 14.3 % (ref 11.5–15.5)
RDW: 14.2 % (ref 11.5–15.5)
WBC: 9.8 10*3/uL (ref 4.0–10.5)
WBC: 11.1 10*3/uL — ABNORMAL HIGH (ref 4.0–10.5)
nRBC: 0 % (ref 0.0–0.2)
nRBC: 0 % (ref 0.0–0.2)

## 2019-10-14 LAB — COMPREHENSIVE METABOLIC PANEL
ALT: 14 U/L (ref 0–44)
AST: 19 U/L (ref 15–41)
Albumin: 2.4 g/dL — ABNORMAL LOW (ref 3.5–5.0)
Alkaline Phosphatase: 154 U/L — ABNORMAL HIGH (ref 38–126)
Anion gap: 11 (ref 5–15)
BUN: 7 mg/dL (ref 6–20)
CO2: 19 mmol/L — ABNORMAL LOW (ref 22–32)
Calcium: 9.3 mg/dL (ref 8.9–10.3)
Chloride: 108 mmol/L (ref 98–111)
Creatinine, Ser: 0.67 mg/dL (ref 0.44–1.00)
GFR calc Af Amer: >60 mL/min (ref >60)
GFR calc non Af Amer: >60 mL/min (ref >60)
Glucose, Bld: 98 mg/dL (ref 70–99)
Potassium: 3.7 mmol/L (ref 3.5–5.1)
Sodium: 138 mmol/L (ref 135–145)
Total Bilirubin: 0.4 mg/dL (ref 0.3–1.2)
Total Protein: 6 g/dL — ABNORMAL LOW (ref 6.5–8.1)

## 2019-10-14 LAB — TYPE AND SCREEN
ABO/RH(D): O NEG
Antibody Screen: POSITIVE

## 2019-10-14 LAB — PROTEIN / CREATININE RATIO, URINE
Creatinine, Urine: 84.66 mg/dL
Protein Creatinine Ratio: 0.74 mg/mg{Cre} — ABNORMAL HIGH (ref 0.00–0.15)
Total Protein, Urine: 63 mg/dL

## 2019-10-14 LAB — ABO/RH: ABO/RH(D): O NEG

## 2019-10-14 LAB — RPR: RPR Ser Ql: NONREACTIVE

## 2019-10-14 MED ORDER — ACETAMINOPHEN 325 MG PO TABS
650.0000 mg | ORAL_TABLET | ORAL | Status: DC | PRN
  Administered 2019-10-15: 650 mg via ORAL
  Filled 2019-10-14: qty 2

## 2019-10-14 MED ORDER — SOD CITRATE-CITRIC ACID 500-334 MG/5ML PO SOLN: 30.0000 mL | ORAL | Status: DC | PRN

## 2019-10-14 MED ORDER — ONDANSETRON HCL 4 MG/2ML IJ SOLN
4.0000 mg | Freq: Four times a day (QID) | INTRAMUSCULAR | Status: DC | PRN
  Administered 2019-10-15 (×2): 4 mg via INTRAVENOUS
  Filled 2019-10-14 (×3): qty 2

## 2019-10-14 MED ORDER — MAGNESIUM SULFATE 40 GM/1000ML IV SOLN
2.0000 g/h | INTRAVENOUS | Status: DC
  Administered 2019-10-15 – 2019-10-16 (×3): 2 g/h via INTRAVENOUS
  Filled 2019-10-14 (×4): qty 1000

## 2019-10-14 MED ORDER — HYDRALAZINE HCL 20 MG/ML IJ SOLN: 10.0000 mg | INTRAMUSCULAR | Status: DC | PRN

## 2019-10-14 MED ORDER — LACTATED RINGERS IV SOLN
500.0000 mL | INTRAVENOUS | Status: DC | PRN
  Administered 2019-10-14: 500 mL via INTRAVENOUS

## 2019-10-14 MED ORDER — LACTATED RINGERS IV SOLN: INTRAVENOUS | Status: DC

## 2019-10-14 MED ORDER — FENTANYL-BUPIVACAINE-NACL 0.5-0.125-0.9 MG/250ML-% EP SOLN
12.0000 mL/h | EPIDURAL | Status: DC | PRN
  Filled 2019-10-14 (×3): qty 250

## 2019-10-14 MED ORDER — OXYTOCIN BOLUS FROM INFUSION: 333.0000 mL | Freq: Once | INTRAVENOUS | Status: DC

## 2019-10-14 MED ORDER — TERBUTALINE SULFATE 1 MG/ML IJ SOLN: 0.2500 mg | Freq: Once | INTRAMUSCULAR | Status: DC | PRN

## 2019-10-14 MED ORDER — FENTANYL CITRATE (PF) 100 MCG/2ML IJ SOLN
INTRAMUSCULAR | Status: AC
  Filled 2019-10-14: qty 2

## 2019-10-14 MED ORDER — FENTANYL CITRATE (PF) 100 MCG/2ML IJ SOLN
25.0000 ug | Freq: Once | INTRAMUSCULAR | Status: AC
  Administered 2019-10-14: 25 ug via INTRAVENOUS

## 2019-10-14 MED ORDER — NIFEDIPINE ER OSMOTIC RELEASE 30 MG PO TB24
30.0000 mg | ORAL_TABLET | Freq: Every day | ORAL | Status: DC
  Administered 2019-10-14: 30 mg via ORAL
  Filled 2019-10-14 (×3): qty 1

## 2019-10-14 MED ORDER — LABETALOL HCL 5 MG/ML IV SOLN
INTRAVENOUS | Status: AC
  Filled 2019-10-14: qty 4

## 2019-10-14 MED ORDER — LABETALOL HCL 5 MG/ML IV SOLN: 80.0000 mg | INTRAVENOUS | Status: DC | PRN

## 2019-10-14 MED ORDER — PHENYLEPHRINE 40 MCG/ML (10ML) SYRINGE FOR IV PUSH (FOR BLOOD PRESSURE SUPPORT): 80.0000 ug | PREFILLED_SYRINGE | INTRAVENOUS | Status: DC | PRN

## 2019-10-14 MED ORDER — MAGNESIUM SULFATE BOLUS VIA INFUSION
4.0000 g | Freq: Once | INTRAVENOUS | Status: AC
  Administered 2019-10-14: 4 g via INTRAVENOUS
  Filled 2019-10-14: qty 1000

## 2019-10-14 MED ORDER — MISOPROSTOL 25 MCG QUARTER TABLET
25.0000 ug | ORAL_TABLET | ORAL | Status: AC
  Administered 2019-10-14: 25 ug via VAGINAL
  Filled 2019-10-14: qty 1

## 2019-10-14 MED ORDER — OXYTOCIN-SODIUM CHLORIDE 30-0.9 UT/500ML-% IV SOLN
1.0000 m[IU]/min | INTRAVENOUS | Status: DC
  Administered 2019-10-14: 2 m[IU]/min via INTRAVENOUS
  Filled 2019-10-14 (×2): qty 500

## 2019-10-14 MED ORDER — LIDOCAINE HCL (PF) 1 % IJ SOLN
30.0000 mL | INTRAMUSCULAR | Status: DC | PRN
  Filled 2019-10-14: qty 30

## 2019-10-14 MED ORDER — LABETALOL HCL 5 MG/ML IV SOLN
20.0000 mg | INTRAVENOUS | Status: DC | PRN
  Administered 2019-10-14 – 2019-10-15 (×2): 20 mg via INTRAVENOUS
  Filled 2019-10-14: qty 4

## 2019-10-14 MED ORDER — LABETALOL HCL 5 MG/ML IV SOLN
40.0000 mg | INTRAVENOUS | Status: DC | PRN
  Administered 2019-10-14 – 2019-10-15 (×2): 40 mg via INTRAVENOUS
  Filled 2019-10-14 (×2): qty 8

## 2019-10-14 MED ORDER — SODIUM CHLORIDE (PF) 0.9 % IJ SOLN
INTRAMUSCULAR | Status: DC | PRN
  Administered 2019-10-14: 12 mL/h via EPIDURAL

## 2019-10-14 MED ORDER — EPHEDRINE 5 MG/ML INJ: 10.0000 mg | INTRAVENOUS | Status: DC | PRN

## 2019-10-14 MED ORDER — OXYTOCIN-SODIUM CHLORIDE 30-0.9 UT/500ML-% IV SOLN: 2.5000 [IU]/h | INTRAVENOUS | Status: DC

## 2019-10-14 MED ORDER — LACTATED RINGERS IV SOLN: 500.0000 mL | Freq: Once | INTRAVENOUS | Status: DC

## 2019-10-14 MED ORDER — LIDOCAINE HCL (PF) 1 % IJ SOLN
INTRAMUSCULAR | Status: DC | PRN
  Administered 2019-10-14 (×2): 6 mL via EPIDURAL
  Administered 2019-10-15 (×2): 5 mL via EPIDURAL

## 2019-10-14 MED ORDER — DIPHENHYDRAMINE HCL 50 MG/ML IJ SOLN: 12.5000 mg | INTRAMUSCULAR | Status: DC | PRN

## 2019-10-14 NOTE — Progress Notes (Signed)
OBGYN Note S: comfortable with epidural, denies HA Vitals:   10/14/19 2100 10/14/19 2116 10/14/19 2200 10/14/19 2211  BP: (!) 149/89  (!) 129/91   Pulse: 79  87   Resp:  16  16  Temp:    98.5 F (36.9 C)  TempSrc:    Oral  SpO2:      Weight:      Height:       SVE 4/70/-1, AROM clear fluid FHR 135, Cat 1 Toco q1-97m -Continue magnesium for severe preeclampsia -Continue pitocin Margaret Schmidt K Taam-Akelman 10/14/19 10:40 PM

## 2019-10-14 NOTE — Anesthesia Preprocedure Evaluation (Signed)
Anesthesia Evaluation  Patient identified by MRN, date of birth, ID band Patient awake    Reviewed: Allergy & Precautions, H&P , NPO status , Patient's Chart, lab work & pertinent test results  Airway Mallampati: II  TM Distance: >3 FB Neck ROM: full    Dental no notable dental hx. (+) Teeth Intact   Pulmonary neg pulmonary ROS,    Pulmonary exam normal breath sounds clear to auscultation       Cardiovascular hypertension, Pt. on medications  Rhythm:regular Rate:Normal     Neuro/Psych negative neurological ROS  negative psych ROS   GI/Hepatic negative GI ROS, Neg liver ROS,   Endo/Other  Morbid obesity  Renal/GU negative Renal ROS  negative genitourinary   Musculoskeletal negative musculoskeletal ROS (+)   Abdominal (+) + obese,   Peds  Hematology negative hematology ROS (+)   Anesthesia Other Findings   Reproductive/Obstetrics (+) Pregnancy                             Anesthesia Physical Anesthesia Plan  ASA: III  Anesthesia Plan: Epidural   Post-op Pain Management:    Induction:   PONV Risk Score and Plan:   Airway Management Planned:   Additional Equipment:   Intra-op Plan:   Post-operative Plan:   Informed Consent: I have reviewed the patients History and Physical, chart, labs and discussed the procedure including the risks, benefits and alternatives for the proposed anesthesia with the patient or authorized representative who has indicated his/her understanding and acceptance.       Plan Discussed with:   Anesthesia Plan Comments:         Anesthesia Quick Evaluation

## 2019-10-14 NOTE — Progress Notes (Signed)
RN notified Dr. Arby Barrette, anesthesiologist, of severe range blood pressures while patient was awaiting epidural placement. RN to hold IV labetalol at this time per Dr. Arby Barrette due to risk of severe hypotension post epidural placement.

## 2019-10-14 NOTE — H&P (Signed)
Margaret Schmidt is a 34 y.o. female G1P0 105w1d presenting for IOL 2/2 GHTN. She reports no LOF, VB, contractions. Normal FM. Denies HA/VC/RUQ pain/SOB. Exam done with interpreter [phone]  Pregnancy c/b: 1. GHTN: Diagnosed at [redacted]w[redacted]d, planned for IOL at [redacted]w[redacted]d but patient requested female provider unless emergency, here for IOL at [redacted]w[redacted]d without severe features. Last EFW at [redacted]w[redacted]d 2937g (52.3%). Labs 7/2 UPC 0.17, CBC and CMP wnl.  2. 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 Obesity: prepregnancy BMI 36 5. Artificial insemination  OB History    Gravida  1   Para      Term      Preterm      AB      Living        SAB      TAB      Ectopic      Multiple      Live Births             Past Medical History:  Diagnosis Date  . Medical history non-contributory    Past Surgical History:  Procedure Laterality Date  . NO PAST SURGERIES     Family History: family history is not on file. Social History:  reports that she has never smoked. She has never used smokeless tobacco. She reports that she does not drink alcohol and does not use drugs.     Maternal Diabetes: No Genetic Screening: Declined Maternal Ultrasounds/Referrals: Normal posterior placenta Fetal Ultrasounds or other Referrals:  None Maternal Substance Abuse:  No Significant Maternal Medications:  None Significant Maternal Lab Results:  Group B Strep negative Other Comments:  None  Review of Systems Per HPI Exam Physical Exam  Blood pressure (!) 158/98, pulse 80, temperature 98.7 F (37.1 C), temperature source Oral, resp. rate 20, height 5\' 6"  (1.676 m), weight 113.9 kg. NAD, resting comfortably Gravid abdomen, 6.5# Fetal testing: FHR 150, +moderate variability, +accels. No decels. Toco quiet Prenatal labs: ABO, Rh:  --/--/O NEG (07/06 0756) Antibody: POS (07/06 0756) Rubella: Immune (12/30 0000) RPR: Nonreactive (12/30 0000)   HBsAg: Negative (12/30 0000)  HIV: Non-reactive (12/30 0000)  GBS: Negative/-- (06/29 0000)  Recent Labs    10/14/19 0743  WBC 9.8  HGB 11.0*  HCT 33.7*  PLT 253   Assessment/Plan: Margaret Schmidt 34 y.o. G1P0 at [redacted]w[redacted]d here for IOL 2/2 GHTN 1. IOL: SVE on admit 0.5/thick, plan for IOL with cytotec for ripening then pit/arom prn. Desires epidural. 2. GHTN: Admit Hgb 11, Plt 253, monitor for severe features 3. 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 Obesity: prepregnancy BMI 36  Margaret Schmidt 10/14/2019, 9:48 AM

## 2019-10-14 NOTE — Progress Notes (Signed)
OBGYN Note Patient feeling cramps - no other complaints  Vitals:   10/14/19 1353 10/14/19 1400 10/14/19 1505 10/14/19 1531  BP: 137/78 (!) 143/79 (!) 158/90 (!) 177/105  Pulse: 79 84 77 79  Resp:  16 18   Temp:      TempSrc:      SpO2:      Weight:      Height:       Recent Labs    10/14/19 0743 10/14/19 1343  WBC 9.8 11.1*  HGB 11.0* 10.6*  HCT 33.7* 33.3*  PLT 253 237    Recent Labs    10/14/19 1343  NA 138  K 3.7  CL 108  BUN 7  CREATININE 0.67  GLUCOSE 98  BILITOT 0.4  ALT 14  AST 19  ALKPHOS 154*  PROT 6.0*  ALBUMIN 2.4*    Recent Labs    10/14/19 1343  CALCIUM 9.3   UPC 0.74  SVE - 1/long/-3, foley placed at 1430  Marquis Finlay 33 y.o. G1P0 at [redacted]w[redacted]d here for IOL 2/2 GHTN, now with severe preeclampsia 1. IOL: SVE on admit 0.5/thick, s/p cytotec, then contracting frequently, foley balloon placed, start pitocin prn 2. GHTN: Admit Hgb 11, Plt 253, developed severe BP during labor admission, and now UPC 0.74, s/p labetalol 20mg , 40mg  IV (13:01), will start procardia 30 xL, started magnesium 4g followed by 2g/h for seizure prophylaxis. CBC/CMP wnl, will repeat in 12 hours. 3. 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 Obesity: prepregnancy BMI 36 Margaret Schmidt 10/14/19 3:48 PM

## 2019-10-14 NOTE — Progress Notes (Signed)
Interpreter [phone] used for initiation of Magnesium Sulfate.

## 2019-10-14 NOTE — Anesthesia Procedure Notes (Addendum)
Epidural Patient location during procedure: OB Start time: 10/14/2019 3:57 PM End time: 10/14/2019 4:13 PM  Staffing Anesthesiologist: Leilani Able, MD Performed: anesthesiologist   Preanesthetic Checklist Completed: patient identified, IV checked, site marked, risks and benefits discussed, surgical consent, monitors and equipment checked, pre-op evaluation and timeout performed  Epidural Patient position: sitting Prep: DuraPrep and site prepped and draped Patient monitoring: continuous pulse ox and blood pressure Approach: midline Location: L3-L4 Injection technique: LOR air  Needle:  Needle type: Tuohy  Needle gauge: 17 G Needle length: 9 cm and 9 Needle insertion depth: 8 cm Catheter type: closed end flexible Catheter size: 19 Gauge Catheter at skin depth: 13 cm Test dose: negative and Other  Assessment Events: blood not aspirated, injection not painful, no injection resistance, no paresthesia and negative IV test  Additional Notes Reason for block:procedure for pain

## 2019-10-14 NOTE — Progress Notes (Signed)
Interpreter [phone] used for epidural procedure.

## 2019-10-15 LAB — COMPREHENSIVE METABOLIC PANEL
ALT: 15 U/L (ref 0–44)
ALT: 16 U/L (ref 0–44)
AST: 24 U/L (ref 15–41)
AST: 21 U/L (ref 15–41)
Albumin: 2.5 g/dL — ABNORMAL LOW (ref 3.5–5.0)
Albumin: 2.3 g/dL — ABNORMAL LOW (ref 3.5–5.0)
Alkaline Phosphatase: 141 U/L — ABNORMAL HIGH (ref 38–126)
Alkaline Phosphatase: 158 U/L — ABNORMAL HIGH (ref 38–126)
Anion gap: 10 (ref 5–15)
Anion gap: 10 (ref 5–15)
BUN: 5 mg/dL — ABNORMAL LOW (ref 6–20)
BUN: <5 mg/dL — ABNORMAL LOW (ref 6–20)
CO2: 19 mmol/L — ABNORMAL LOW (ref 22–32)
CO2: 19 mmol/L — ABNORMAL LOW (ref 22–32)
Calcium: 8.4 mg/dL — ABNORMAL LOW (ref 8.9–10.3)
Calcium: 7.7 mg/dL — ABNORMAL LOW (ref 8.9–10.3)
Chloride: 104 mmol/L (ref 98–111)
Chloride: 102 mmol/L (ref 98–111)
Creatinine, Ser: 0.73 mg/dL (ref 0.44–1.00)
Creatinine, Ser: 0.8 mg/dL (ref 0.44–1.00)
GFR calc Af Amer: >60 mL/min (ref >60)
GFR calc Af Amer: >60 mL/min (ref >60)
GFR calc non Af Amer: >60 mL/min (ref >60)
GFR calc non Af Amer: >60 mL/min (ref >60)
Glucose, Bld: 100 mg/dL — ABNORMAL HIGH (ref 70–99)
Glucose, Bld: 98 mg/dL (ref 70–99)
Potassium: 3.9 mmol/L (ref 3.5–5.1)
Potassium: 4 mmol/L (ref 3.5–5.1)
Sodium: 133 mmol/L — ABNORMAL LOW (ref 135–145)
Sodium: 131 mmol/L — ABNORMAL LOW (ref 135–145)
Total Bilirubin: 0.6 mg/dL (ref 0.3–1.2)
Total Bilirubin: 0.4 mg/dL (ref 0.3–1.2)
Total Protein: 6.3 g/dL — ABNORMAL LOW (ref 6.5–8.1)
Total Protein: 5.9 g/dL — ABNORMAL LOW (ref 6.5–8.1)

## 2019-10-15 LAB — URINALYSIS, ROUTINE W REFLEX MICROSCOPIC
Bilirubin Urine: NEGATIVE
Glucose, UA: NEGATIVE mg/dL
Ketones, ur: NEGATIVE mg/dL
Nitrite: NEGATIVE
Protein, ur: 30 mg/dL — AB
RBC / HPF: >50 RBC/hpf — ABNORMAL HIGH (ref 0–5)
Specific Gravity, Urine: 1.014 (ref 1.005–1.030)
WBC, UA: >50 WBC/hpf — ABNORMAL HIGH (ref 0–5)
pH: 5 (ref 5.0–8.0)

## 2019-10-15 LAB — CBC
HCT: 35 % — ABNORMAL LOW (ref 36.0–46.0)
HCT: 36.7 % (ref 36.0–46.0)
Hemoglobin: 11.4 g/dL — ABNORMAL LOW (ref 12.0–15.0)
Hemoglobin: 11.7 g/dL — ABNORMAL LOW (ref 12.0–15.0)
MCH: 27.3 pg (ref 26.0–34.0)
MCH: 26.5 pg (ref 26.0–34.0)
MCHC: 32.6 g/dL (ref 30.0–36.0)
MCHC: 31.9 g/dL (ref 30.0–36.0)
MCV: 83.7 fL (ref 80.0–100.0)
MCV: 83.2 fL (ref 80.0–100.0)
Platelets: 271 10*3/uL (ref 150–400)
Platelets: 277 10*3/uL (ref 150–400)
RBC: 4.18 MIL/uL (ref 3.87–5.11)
RBC: 4.41 MIL/uL (ref 3.87–5.11)
RDW: 14.6 % (ref 11.5–15.5)
RDW: 14.6 % (ref 11.5–15.5)
WBC: 13.4 10*3/uL — ABNORMAL HIGH (ref 4.0–10.5)
WBC: 18.7 10*3/uL — ABNORMAL HIGH (ref 4.0–10.5)
nRBC: 0 % (ref 0.0–0.2)
nRBC: 0 % (ref 0.0–0.2)

## 2019-10-15 NOTE — Progress Notes (Signed)
Cuff not on patient's arm..Adjusted cuff.

## 2019-10-15 NOTE — Progress Notes (Signed)
Dr. Tenny Craw called again about severe range blood pressure. RN requested to treat blood pressure and Dr. Tenny Craw agreed.

## 2019-10-15 NOTE — Progress Notes (Signed)
Dr. Tenny Craw at bedside. Requested no treatment at this time until new epidural placed and pain under control.

## 2019-10-15 NOTE — Progress Notes (Signed)
Dr. Tenny Craw at bedside, no treatment for blood pressure at this time due to pts pain level per Dr. Tenny Craw

## 2019-10-15 NOTE — Progress Notes (Signed)
Patient ID: Margaret Schmidt, female   DOB: 1986/01/14, 34 y.o.   MRN: [id number]  S: Comfortable with epidural O: Afebrile, vital signs stable, blood pressures 125-153/96 Fetal heart rate 130, reactive, category 1 tracing Cervix 5-6/50/-2 Toco irregular, 2-5  Amniotomy performed for clear fluid  Assessment and plan 1.)  Continue augmentation with Pitocin 2) fetal wellbeing reassuring 3) blood pressures in the mild range, continue magnesium for seizure prophylaxis

## 2019-10-15 NOTE — Progress Notes (Signed)
Epidural just placed and doses, pt already getting more comfortable. Dr. Tenny Craw at bedside and requested one more read before treating BP again.

## 2019-10-15 NOTE — Progress Notes (Signed)
Unable to reposition blood pressure cuff at this time due to patient's pain level and position in bed.

## 2019-10-15 NOTE — Progress Notes (Signed)
Interpreter used for conversation with pt, her husband, myself and Dr. Tenny Craw

## 2019-10-15 NOTE — Progress Notes (Signed)
Dr. Tenny Craw called about BP. She requested no treatment at this time and move cuff to leg and reevaluate.

## 2019-10-15 NOTE — Anesthesia Procedure Notes (Signed)
Epidural Patient location during procedure: OB Start time: 10/15/2019 8:52 PM End time: 10/15/2019 9:07 PM  Staffing Anesthesiologist: Heather Roberts, MD Performed: anesthesiologist   Preanesthetic Checklist Completed: patient identified, IV checked, site marked, risks and benefits discussed, monitors and equipment checked, pre-op evaluation and timeout performed  Epidural Patient position: sitting Prep: DuraPrep Patient monitoring: heart rate, cardiac monitor, continuous pulse ox and blood pressure Approach: midline Location: L2-L3 Injection technique: LOR saline  Needle:  Needle type: Tuohy  Needle gauge: 17 G Needle length: 9 cm Needle insertion depth: 8 cm Catheter size: 20 Guage Catheter at skin depth: 12 cm Test dose: negative and Other  Assessment Events: blood not aspirated, injection not painful, no injection resistance and negative IV test  Additional Notes Informed consent obtained prior to proceeding including risk of failure, 1% risk of PDPH, risk of minor discomfort and bruising.  Discussed rare but serious complications including epidural abscess, permanent nerve injury, epidural hematoma.  Discussed alternatives to epidural analgesia and patient desires to proceed.  Timeout performed pre-procedure verifying patient name, procedure, and platelet count.  Patient tolerated procedure well.

## 2019-10-15 NOTE — Progress Notes (Signed)
Dr. Tenny Craw notified of blood pressure and requested RN go ahead with treatment. See [facility]

## 2019-10-15 NOTE — Progress Notes (Signed)
Interpreter used to explain epidural placement to pt.

## 2019-10-15 NOTE — Progress Notes (Signed)
Interpreter used for conversation with pt and Dr. Tenny Craw

## 2019-10-15 NOTE — Progress Notes (Signed)
Dr. Krista Blue at bedside.  Used interpreter [id number] to discuss epidural with patient and FOB at bedside. Gave option of replacing epidural or increasing dose.  Patient asked for increase of dose. Dr. Krista Blue increased epidural dose.  Patient states understanding.

## 2019-10-16 ENCOUNTER — Encounter (HOSPITAL_COMMUNITY): Payer: Self-pay | Admitting: Obstetrics & Gynecology

## 2019-10-16 ENCOUNTER — Encounter (HOSPITAL_COMMUNITY)
Admission: AD | Disposition: A | Discharge status: Home / Self Care | Payer: Self-pay | Source: Home / Self Care | Attending: Obstetrics and Gynecology

## 2019-10-16 HISTORY — PX: DILATION AND CURETTAGE OF UTERUS: SHX78

## 2019-10-16 LAB — CBC
HCT: 26.9 % — ABNORMAL LOW (ref 36.0–46.0)
Hemoglobin: 8.7 g/dL — ABNORMAL LOW (ref 12.0–15.0)
MCH: 27.4 pg (ref 26.0–34.0)
MCHC: 32.3 g/dL (ref 30.0–36.0)
MCV: 84.6 fL (ref 80.0–100.0)
Platelets: 279 10*3/uL (ref 150–400)
RBC: 3.18 MIL/uL — ABNORMAL LOW (ref 3.87–5.11)
RDW: 14.6 % (ref 11.5–15.5)
WBC: 21.7 10*3/uL — ABNORMAL HIGH (ref 4.0–10.5)
nRBC: 0 % (ref 0.0–0.2)

## 2019-10-16 LAB — MYOGLOBIN, URINE: Myoglobin, Ur: <2 ng/mL (ref 0–13)

## 2019-10-16 SURGERY — DILATION AND CURETTAGE
Anesthesia: Epidural

## 2019-10-16 MED ORDER — CEFAZOLIN SODIUM-DEXTROSE 2-3 GM-%(50ML) IV SOLR
INTRAVENOUS | Status: DC | PRN
Start: 2019-10-16 — End: 2019-10-16
  Administered 2019-10-16: 2 g via INTRAVENOUS

## 2019-10-16 MED ORDER — LACTATED RINGERS IV SOLN
INTRAVENOUS | Status: DC | PRN
Start: 2019-10-16 — End: 2019-10-16

## 2019-10-16 MED ORDER — LACTATED RINGERS IV SOLN: INTRAVENOUS | Status: DC

## 2019-10-16 MED ORDER — ZOLPIDEM TARTRATE 5 MG PO TABS
5.0000 mg | ORAL_TABLET | Freq: Every evening | ORAL | Status: DC | PRN
  Administered 2019-10-17: 5 mg via ORAL
  Filled 2019-10-16: qty 1

## 2019-10-16 MED ORDER — ONDANSETRON HCL 4 MG PO TABS: 4.0000 mg | ORAL_TABLET | ORAL | Status: DC | PRN

## 2019-10-16 MED ORDER — BENZOCAINE-MENTHOL 20-0.5 % EX AERO: 1.0000 "application " | INHALATION_SPRAY | CUTANEOUS | Status: DC | PRN

## 2019-10-16 MED ORDER — DIPHENHYDRAMINE HCL 25 MG PO CAPS: 25.0000 mg | ORAL_CAPSULE | Freq: Four times a day (QID) | ORAL | Status: DC | PRN

## 2019-10-16 MED ORDER — SODIUM BICARBONATE 8.4 % IV SOLN
INTRAVENOUS | Status: DC | PRN
  Administered 2019-10-16: 3 mL via EPIDURAL
  Administered 2019-10-16: 5 mL via EPIDURAL

## 2019-10-16 MED ORDER — COCONUT OIL OIL: 1.0000 "application " | TOPICAL_OIL | Status: DC | PRN

## 2019-10-16 MED ORDER — ACETAMINOPHEN 325 MG PO TABS
650.0000 mg | ORAL_TABLET | ORAL | Status: DC | PRN
  Administered 2019-10-18: 650 mg via ORAL
  Filled 2019-10-16: qty 2

## 2019-10-16 MED ORDER — ONDANSETRON HCL 4 MG/2ML IJ SOLN
INTRAMUSCULAR | Status: DC | PRN
  Administered 2019-10-16: 4 mg via INTRAVENOUS

## 2019-10-16 MED ORDER — OXYCODONE HCL 5 MG PO TABS
5.0000 mg | ORAL_TABLET | ORAL | Status: DC | PRN
  Administered 2019-10-16 – 2019-10-18 (×3): 5 mg via ORAL
  Filled 2019-10-16 (×3): qty 1

## 2019-10-16 MED ORDER — TETANUS-DIPHTH-ACELL PERTUSSIS 5-2.5-18.5 LF-MCG/0.5 IM SUSP: 0.5000 mL | Freq: Once | INTRAMUSCULAR | Status: DC

## 2019-10-16 MED ORDER — FENTANYL CITRATE (PF) 100 MCG/2ML IJ SOLN
INTRAMUSCULAR | Status: DC | PRN
  Administered 2019-10-16: 100 ug via EPIDURAL

## 2019-10-16 MED ORDER — SIMETHICONE 80 MG PO CHEW: 80.0000 mg | CHEWABLE_TABLET | ORAL | Status: DC | PRN

## 2019-10-16 MED ORDER — FENTANYL CITRATE (PF) 100 MCG/2ML IJ SOLN
INTRAMUSCULAR | Status: AC
  Filled 2019-10-16: qty 2

## 2019-10-16 MED ORDER — OXYCODONE HCL 5 MG PO TABS: 10.0000 mg | ORAL_TABLET | ORAL | Status: DC | PRN

## 2019-10-16 MED ORDER — PHENYLEPHRINE HCL (PRESSORS) 10 MG/ML IV SOLN
INTRAVENOUS | Status: DC | PRN
  Administered 2019-10-16 (×2): 40 ug via INTRAVENOUS

## 2019-10-16 MED ORDER — WITCH HAZEL-GLYCERIN EX PADS: 1.0000 "application " | MEDICATED_PAD | CUTANEOUS | Status: DC | PRN

## 2019-10-16 MED ORDER — SENNOSIDES-DOCUSATE SODIUM 8.6-50 MG PO TABS
2.0000 | ORAL_TABLET | ORAL | Status: DC
  Administered 2019-10-16: 2 via ORAL
  Filled 2019-10-16 (×2): qty 2

## 2019-10-16 MED ORDER — DIBUCAINE (PERIANAL) 1 % EX OINT: 1.0000 "application " | TOPICAL_OINTMENT | CUTANEOUS | Status: DC | PRN

## 2019-10-16 MED ORDER — PRENATAL MULTIVITAMIN CH
1.0000 | ORAL_TABLET | Freq: Every day | ORAL | Status: DC
  Administered 2019-10-16 – 2019-10-19 (×4): 1 via ORAL
  Filled 2019-10-16 (×4): qty 1

## 2019-10-16 MED ORDER — ONDANSETRON HCL 4 MG/2ML IJ SOLN: 4.0000 mg | INTRAMUSCULAR | Status: DC | PRN

## 2019-10-16 MED ORDER — ONDANSETRON HCL 4 MG/2ML IJ SOLN
INTRAMUSCULAR | Status: AC
  Filled 2019-10-16: qty 2

## 2019-10-16 MED ORDER — IBUPROFEN 600 MG PO TABS
600.0000 mg | ORAL_TABLET | Freq: Four times a day (QID) | ORAL | Status: DC
  Administered 2019-10-16 – 2019-10-19 (×13): 600 mg via ORAL
  Filled 2019-10-16 (×13): qty 1

## 2019-10-16 MED ORDER — PROPOFOL 10 MG/ML IV BOLUS
INTRAVENOUS | Status: AC
  Filled 2019-10-16: qty 20

## 2019-10-16 MED ORDER — OXYTOCIN-SODIUM CHLORIDE 30-0.9 UT/500ML-% IV SOLN
INTRAVENOUS | Status: DC | PRN
  Administered 2019-10-16: 30 [IU] via INTRAVENOUS

## 2019-10-16 MED ORDER — SODIUM CHLORIDE 0.9 % IV SOLN
INTRAVENOUS | Status: DC | PRN
Start: 2019-10-16 — End: 2019-10-16

## 2019-10-16 SURGICAL SUPPLY — 7 items
SET BERKELEY SUCTION TUBING (SUCTIONS) ×2 IMPLANT
SUT VIC AB 3-0 CT1 27 (SUTURE) ×1
SUT VIC AB 3-0 CT1 TAPERPNT 27 (SUTURE) ×1 IMPLANT
SUT VIC AB 3-0 SH 27 (SUTURE) ×1
SUT VIC AB 3-0 SH 27X BRD (SUTURE) ×1 IMPLANT
VACURETTE 16MM ASPIR CVD .5 (CANNULA) ×2 IMPLANT
WATER STERILE IRR 1000ML POUR (IV SOLUTION) ×2 IMPLANT

## 2019-10-16 NOTE — Anesthesia Postprocedure Evaluation (Signed)
Anesthesia Post Note  Patient: Margaret Schmidt  Procedure(s) Performed: DILATATION AND CURETTAGE (N/A )     Patient location during evaluation: OB High Risk Anesthesia Type: Epidural Level of consciousness: awake and alert Pain management: pain level controlled Vital Signs Assessment: post-procedure vital signs reviewed and stable Respiratory status: spontaneous breathing Cardiovascular status: stable Postop Assessment: no headache, adequate PO intake, no backache, patient able to bend at knees, able to ambulate, epidural receding and no apparent nausea or vomiting Anesthetic complications: no   No complications documented.  Last Vitals:  Vitals:   10/16/19 0729 10/16/19 0800  BP: 131/71   Pulse: 100   Resp: 18 18  Temp: 37.1 C   SpO2: 98%     Last Pain:  Vitals:   10/16/19 0831  TempSrc:   PainSc: 7    Pain Goal:                   Salome Arnt

## 2019-10-16 NOTE — Lactation Note (Signed)
This note was copied from a baby's chart. Lactation Consultation Note  Patient Name: Margaret Schmidt [id number] Date: 10/16/2019 Reason for consult: Initial assessment;Mother's request;Difficult latch;Primapara;1st time breastfeeding;Early term 37-38.6wks  0940 - 1005 - Interpretor: Adair Laundry #[id number].  I conducted an initial lactation consult with Margaret Schmidt and her 29 hour old daughter, Margaret Schmidt. Margaret Schmidt is a P1 and first time breast feeding patient. I offered to assist with breast feeding, and she agreed.  I undressed Margaret Schmidt and changed a meconium diaper. Baby did not rouse with diaper change. I educated on infant feeding cues and stated that baby typically eats 8-12 times a day.  I then taught hand expression on the left breast. I noted a glisten of colostrum, and Margaret Schmidt is a bit tender on this side.  We placed baby STS in the cross cradle hold and I showed Margaret Schmidt how to grasp her breast. Baby opened a little and held nipple in her mouth with no actual latch. We practiced lick and learn; baby did some licking but was ultimately too sleepy to latch.  I educated on day 1 and day 2 infant feeding patterns. I recommended that Margaret Schmidt hold baby STS (and I placed baby on her chest), and feed on demand. I recommended trying to wake baby up around noon if baby does not cue. We discuss gentle waking measures.  Margaret Schmidt would like lactation to follow up later today or this evening, if possible. She does need a brochure.   Our interpretor connection was lost towards the end of the consult. Some reinforcement of teaching with interpretor would be helpful later today.  Maternal Data Has patient been taught Hand Expression?: Yes Does the patient have breastfeeding experience prior to this delivery?: No  Feeding    LATCH Score Latch: Too sleepy or reluctant, no latch achieved, no sucking elicited.  Audible Swallowing: None  Type of Nipple: Everted at rest and after stimulation  Comfort  (Breast/Nipple): Soft / non-tender  Hold (Positioning): Assistance needed to correctly position infant at breast and maintain latch.  LATCH Score: 5  Interventions Interventions: Breast feeding basics reviewed;Assisted with latch;Skin to skin;Hand express;Breast compression;Adjust position   Consult Status Consult Status: Follow-up Date: 10/16/19 Follow-up type: In-patient    Walker Shadow 10/16/2019, 10:07 AM

## 2019-10-16 NOTE — Transfer of Care (Signed)
Immediate Anesthesia Transfer of Care Note  Patient: Margaret Schmidt  Procedure(s) Performed: DILATATION AND CURETTAGE (N/A )  Patient Location: PACU  Anesthesia Type:Epidural  Level of Consciousness: awake, alert  and oriented  Airway & Oxygen Therapy: Patient Spontanous Breathing  Post-op Assessment: Report given to RN and Post -op Vital signs reviewed and stable  Post vital signs: Reviewed and stable  Last Vitals:  Vitals Value Taken Time  BP 122/96 10/16/19 0515  Temp    Pulse 96 10/16/19 0518  Resp 21 10/16/19 0518  SpO2 100 % 10/16/19 0518  Vitals shown include unvalidated device data.  Last Pain:  Vitals:   10/16/19 0130  TempSrc: Oral  PainSc:          Complications: No complications documented.

## 2019-10-16 NOTE — Progress Notes (Addendum)
Patient is eating, ambulating, voiding.  Pain control is good.  Vitals:   10/16/19 0530 10/16/19 0545 10/16/19 0600 10/16/19 0638  BP: (!) 111/56 109/71 107/68 125/61  Pulse: 95 96 100 (!) 105  Resp: 18 (!) 22 20 18   Temp:   99.8 F (37.7 C) 99.8 F (37.7 C)  TempSrc:   Oral Oral  SpO2:    98%  Weight:      Height:        Fundus firm Perineum without swelling.  Lab Results  Component Value Date   WBC 21.7 (H) 10/16/2019   HGB 8.7 (L) 10/16/2019   HCT 26.9 (L) 10/16/2019   MCV 84.6 10/16/2019   PLT 279 10/16/2019    --/--/O NEG Performed at [facility], [address] [place], [place]  (07/06 0843)/[place]  A/P Post partum day 0.  PO#0 from [facility] for retained placenta and repair of episiotomy and genital closure  Routine care.  Expect d/c one to two days.    Baby is RH NEG- no rhogam needed. Anemia- iron, anemia precautions. Preeclampsia: on magnesium sulfate until tomorrow.  BPs stable without additional antihypertensives.  [facility]

## 2019-10-16 NOTE — Progress Notes (Signed)
Pt OOB. Foley catheter and epidural catheter removed. Pt voiding without difficulty. OOB assessment WNL. Carmelina Dane, RN

## 2019-10-16 NOTE — Anesthesia Postprocedure Evaluation (Signed)
Anesthesia Post Note  Patient: Lateia Mehta  Procedure(s) Performed: DILATATION AND CURETTAGE (N/A )     Patient location during evaluation: Mother Baby Anesthesia Type: Epidural Level of consciousness: oriented and awake and alert Pain management: pain level controlled Vital Signs Assessment: post-procedure vital signs reviewed and stable Respiratory status: spontaneous breathing and respiratory function stable Cardiovascular status: blood pressure returned to baseline and stable Postop Assessment: no headache, no backache, no apparent nausea or vomiting and able to ambulate Anesthetic complications: no   No complications documented.  Last Vitals:  Vitals:   10/16/19 0600 10/16/19 0638  BP: 107/68 125/61  Pulse: 100 (!) 105  Resp: 20 18  Temp: 37.7 C 37.7 C  SpO2:  98%    Last Pain:  Vitals:   10/16/19 0638  TempSrc: Oral  PainSc: 0-No pain   Pain Goal:                Epidural/Spinal Function Cutaneous sensation: Normal sensation (10/16/19 4268), Patient able to flex knees: Yes (10/16/19 3419), Patient able to lift hips off bed: Yes (10/16/19 6222), Back pain beyond tenderness at insertion site: No (10/16/19 9798), Progressively worsening motor and/or sensory loss: No (10/16/19 9211), Bowel and/or bladder incontinence post epidural: No (10/16/19 9417)  Heather Roberts DANIEL

## 2019-10-16 NOTE — Addendum Note (Signed)
Addendum  created 10/16/19 1821 by Heather Roberts, MD   Intraprocedure Event edited

## 2019-10-16 NOTE — Lactation Note (Signed)
This note was copied from a baby's chart. Lactation Consultation Note  Patient Name: Margaret Schmidt [id number] Date: 10/16/2019 Reason for consult: Follow-up assessment;Mother's request;Difficult latch P1, 19 hour ETI female infant. Interpreter used # [phone] Randa Evens (Arabic)  Mom is currently breast and formula feeding, recently mostly formula feeding and not latch infant at breast.  LC notice family was  Given the  wrong formula,  they need Haal due to their religous beliefs, LC alerted RN and moving forward family will use 20 kcal Similac with iron formula. Mom attempted  to latch infant on her left breast using the football hold position, infant reluctant to latch at this time, infant was given 5 mls of colostrum that mom hand expressed. Dad informed LC that infant had received 6 mls of Gerber Gentle with iron about 30 minutes prior to [facility] entering the room.  Parents will continue to do STS. LC discussed mom  using the DEBP to help establish her  milk supply and mom understands to pump every 3 hours for 15 minutes on initial setting. Mom will continue to work towards latching infant at breast before giving infant formula.  Mom will continue to hand express  After pumping and give infant any of her  EBM.  Mom's plan: 1. Mom will ask RN for assistance with latching infant at breast at next feeding. 2. Dad will supplement infant after BF  with Similac with iron according to infant 's age / hour's of life. 3. While Dad is supplementing infant mom will use DEBP for 15 minutes on initial setting.    Maternal Data    Feeding Feeding Type: Breast Fed  LATCH Score Latch: Too sleepy or reluctant, no latch achieved, no sucking elicited.  Audible Swallowing: None  Type of Nipple: Everted at rest and after stimulation  Comfort (Breast/Nipple): Soft / non-tender  Hold (Positioning): Assistance needed to correctly position infant at breast and maintain latch.  LATCH Score:  5  Interventions Interventions: Adjust position;Breast compression;Support pillows;Position options;Hand express;DEBP  Lactation Tools Discussed/Used Pump Review: Setup, frequency, and cleaning;Milk Storage Initiated by:: Danelle Earthly, IBCLC Date initiated:: 10/17/19   Consult Status Consult Status: Follow-up Date: 10/17/19 Follow-up type: In-patient    Danelle Earthly 10/16/2019, 10:56 PM

## 2019-10-16 NOTE — Op Note (Signed)
Pre-Operative Diagnosis: 1) retained placenta 2) midline episiotomy with second-degree perineal laceration 3) anterior midline laceration Postoperative Diagnosis: 1) retained placenta 2) midline episiotomy with second-degree perineal laceration 3) anterior midline laceration  Procedure: Manual placental extraction and suction Surgeon: Dr. Waynard Reeds Assistant: None Operative Findings: Adherent placenta to the lower uterine segment.  The placenta was successfully manually removed.  The midline episiotomy with second-degree perineal laceration extension was repaired.  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  During the attempted delivery of the placenta in the delivery room the umbilical cord avulsed from the body of the placenta.  Due to patient discomfort and excessive uterine tone the placenta could not be manually extracted in the delivery room and the decision was made to proceed to the operating room for retained placenta.  Risks/benefits/alternatives of the procedure were discussed with the patient, and her husband with the assistance of a Arabic interpreter.  All questions were asked, answered and concerns addressed.  Informed consent was obtained.  Once adequate anesthesia was obtained the patient was placed in the dorsal lithotomy position.  She was appropriately identified during a preoperative timeout procedure.  She was prepped and draped in the normal sterile fashion.  The vagina and uterus were manually explored.  Uterine tone was relaxed and allowed for palpation to the fundus.  The fundal portion of the placenta was identified and the placenta was manually extracted.  At the level of the lower uterine segment anteriorly the placental tissue was more significantly  adherent.  However, the placenta was able to be completely removed.  A suction passed with a #16 suction curette was performed.  The surface of the interior of the uterus was palpated and was noted to be smooth.  Attention was now turned to the repair of the midline episiotomy with second-degree perineal laceration extension.  This was repaired in the standard second-degree fashion with 3-0 Vicryl.  Anteriorly, the female genital mutilation had been incised during labor with crowning of the baby to allow for passage of the infant's head.  This was repaired with a 3-0 Vicryl in a running fashion.  The anterior surface of the repair was closed in a subcuticular fashion.  All sponge, lap, needle counts were correct.  This completed the procedure.  The patient tolerated the procedure well and was transferred to the PACU in stable condition following the procedure.

## 2019-10-17 ENCOUNTER — Encounter (HOSPITAL_COMMUNITY): Payer: Self-pay | Admitting: Obstetrics and Gynecology

## 2019-10-17 LAB — SURGICAL PATHOLOGY

## 2019-10-17 LAB — CBC
HCT: 24.3 % — ABNORMAL LOW (ref 36.0–46.0)
Hemoglobin: 7.5 g/dL — ABNORMAL LOW (ref 12.0–15.0)
MCH: 26.5 pg (ref 26.0–34.0)
MCHC: 30.9 g/dL (ref 30.0–36.0)
MCV: 85.9 fL (ref 80.0–100.0)
Platelets: 258 10*3/uL (ref 150–400)
RBC: 2.83 MIL/uL — ABNORMAL LOW (ref 3.87–5.11)
RDW: 14.9 % (ref 11.5–15.5)
WBC: 13.9 10*3/uL — ABNORMAL HIGH (ref 4.0–10.5)
nRBC: 0 % (ref 0.0–0.2)

## 2019-10-17 MED ORDER — LABETALOL HCL 200 MG PO TABS
200.0000 mg | ORAL_TABLET | Freq: Two times a day (BID) | ORAL | Status: DC
  Administered 2019-10-17 – 2019-10-18 (×2): 200 mg via ORAL
  Filled 2019-10-17 (×2): qty 1

## 2019-10-17 NOTE — Progress Notes (Signed)
PPD#1 Pt has no complaints. Lochia mild. MgSO4 now stopped VS- some systolics mildly elevated. IMP/ Mild elevattion of b/p Plan/ If there is trend in b/ps will start htn meds.

## 2019-10-17 NOTE — Lactation Note (Signed)
This note was copied from a baby's chart. Lactation Consultation Note  Patient Name: Margaret Schmidt [id number] Date: 10/17/2019 Reason for consult: Follow-up assessment;Primapara;1st time breastfeeding;Early term 37-38.6wks GHTN  MgSO4 stopped this am.  Watching BPs.  LC in to visit with P1 Mom of ET infant at 39 hrs old.  Baby at 3% weight loss with good output.   Mom asked if she would like ipad interpreter, her sister is in the room and she would prefer for her to interpret.  Offered to assist with positioning and latching.  Baby swaddled in crib. Undressed baby to provide STS, explaining benefits of baby being STS as much as possible.  Mom states she has nipple pain on initial latch, but not entire feeding.  Assisted Mom in using cross cradle hold.  Hand expressed drops of colostrum.  Baby immediately started cueing.  Assisted Mom to bring baby quickly to breast when mouth is the widest.  Sister assisting Mom to pull breast away from baby's nose.  Explained how this can lead to loss of depth on the breast and soreness on nipples.  Showed Mom how to pull baby's body in closer and jaw in closer than nose.  Baby latch deeply with nutritive sucking and swallowing noted.  Mom has been offering baby formula by bottle, small amounts 3-10 ml.  Mom has a DEBP set up in room.  Mom knows to use it if baby is getting supplemented with formula.  Mom requested a hand pump.  Demonstrated how to use this.  Plan- 1- keep baby STS as much as possible 2- Offer breast with cues, goal of >8 feedings per 24 hrs. 3- if formula feeding, Mom to ask for help with double pump. 4- ask for help prn.  Feeding Feeding Type: Breast Fed  LATCH Score Latch: Grasps breast easily, tongue down, lips flanged, rhythmical sucking.  Audible Swallowing: Spontaneous and intermittent  Type of Nipple: Everted at rest and after stimulation  Comfort (Breast/Nipple): Soft / non-tender  Hold (Positioning): Assistance needed  to correctly position infant at breast and maintain latch.  LATCH Score: 9  Interventions Interventions: Breast feeding basics reviewed;Assisted with latch;Skin to skin;Breast massage;Hand express;Breast compression;Adjust position;Support pillows;Position options;DEBP;Hand pump  Lactation Tools Discussed/Used Tools: Pump;Bottle Breast pump type: Double-Electric Breast Pump   Consult Status Consult Status: Follow-up Date: 10/18/19 Follow-up type: In-patient    Margaret Schmidt 10/17/2019, 1:44 PM

## 2019-10-17 NOTE — Progress Notes (Signed)
Pt still HTN. Will start labetalol

## 2019-10-18 MED ORDER — LABETALOL HCL 200 MG PO TABS
400.0000 mg | ORAL_TABLET | Freq: Three times a day (TID) | ORAL | Status: DC
  Administered 2019-10-18: 400 mg via ORAL
  Filled 2019-10-18: qty 2

## 2019-10-18 MED ORDER — HYDRALAZINE HCL 20 MG/ML IJ SOLN
10.0000 mg | Freq: Once | INTRAMUSCULAR | Status: AC
  Administered 2019-10-18: 10 mg via INTRAVENOUS

## 2019-10-18 MED ORDER — HYDRALAZINE HCL 20 MG/ML IJ SOLN
5.0000 mg | Freq: Once | INTRAMUSCULAR | Status: AC
  Administered 2019-10-18: 5 mg via INTRAVENOUS
  Filled 2019-10-18: qty 1

## 2019-10-18 MED ORDER — LABETALOL HCL 200 MG PO TABS
600.0000 mg | ORAL_TABLET | Freq: Three times a day (TID) | ORAL | Status: DC
  Administered 2019-10-18 – 2019-10-19 (×2): 600 mg via ORAL
  Filled 2019-10-18 (×3): qty 3

## 2019-10-18 MED ORDER — ALPRAZOLAM 0.25 MG PO TABS
0.5000 mg | ORAL_TABLET | Freq: Once | ORAL | Status: DC
  Filled 2019-10-18: qty 2

## 2019-10-18 MED ORDER — FUROSEMIDE 10 MG/ML IJ SOLN
20.0000 mg | Freq: Once | INTRAMUSCULAR | Status: AC
  Administered 2019-10-18: 20 mg via INTRAVENOUS
  Filled 2019-10-18: qty 2

## 2019-10-18 MED ORDER — NIFEDIPINE ER OSMOTIC RELEASE 30 MG PO TB24
30.0000 mg | ORAL_TABLET | Freq: Every day | ORAL | Status: DC
  Administered 2019-10-18 – 2019-10-19 (×2): 30 mg via ORAL
  Filled 2019-10-18 (×2): qty 1

## 2019-10-18 NOTE — Progress Notes (Signed)
Dr. Dareen Piano called back during shift change, POC to give 1x order of 0.5 Xanex and recheck BP in one hour post admin. On coming RN Theresa Mulligan aware of POC.

## 2019-10-18 NOTE — Plan of Care (Signed)
POC discussed with patient and husband. Interpretor Aseel [phone] for language assistance. Dr. Dareen Piano to the bedside to discuss plan for monitoring blood pressure and uping labetalol dosage (Interpretor Aseel # [phone]) used for assistance.

## 2019-10-18 NOTE — Lactation Note (Signed)
This note was copied from a baby's chart. Lactation Consultation Note  Patient Name: Margaret Schmidt [id number] Date: 10/18/2019 Reason for consult: Follow-up assessment;Early term 37-38.6wks;1st time breastfeeding;Primapara  LC in to visit with P1 Mom of ET infant at 56 hrs old.  Baby at 6% weight loss with good output.    Baby is breast and formula feeding by bottle. Volume increasing today.  Mom instructed to offer the breast, asking for help prn, prior to formula feeding.  Mom is not feeling well today, complaining of headache and elevated BPs.  Mom is very tired.  DEBP in corner of room and Mom has not used it yet.   RN aware of Mom's discomfort and will try to assist with latching and/or pumping as Mom is able.   Interventions Interventions: Breast feeding basics reviewed;Skin to skin;Breast massage;Hand express;DEBP  Lactation Tools Discussed/Used Tools: Pump;Bottle Breast pump type: Double-Electric Breast Pump   Consult Status Consult Status: Follow-up Date: 10/19/19 Follow-up type: In-patient    [name] 10/18/2019, 1:38 PM

## 2019-10-18 NOTE — Progress Notes (Signed)
Pt has had several elevated B/Ps She states that she becomes anxious and her b/p increases. She gets anxious everytime the baby cries. She had the labetalol increased to 400mg  TID. B/Ps continued to be elevated and Procardia XL 30mg  was added. She then had a severe range B/P and apresaline was added. She states that she had some SOB. On exam she had 2 + edema to knees. Her lungs are CTA and her pulse ox is 100% Plan/Will increase labetalol to 600mg  TID and may increase procardia to 60mg           Will give lasix 20mg  x 1          May give xanax 0.5mg  for anxiety.

## 2019-10-18 NOTE — Progress Notes (Signed)
Patient's BP was 147/89. Dr. Dareen Piano aware. POC is now to check BP every 30 mins. Request to call MD if > 150/95. Will up Labetalol to 600 mg TID per Dr. Ewell Poe request.

## 2019-10-18 NOTE — Progress Notes (Signed)
PPD#2 Pt was started on labetalol yesterday. Pt had several elevated b/ps and nursing service did not contact me. Pt reports a mild headache this am.  IMP/ HTN not controlled with current dose of labetalol Plan/ Will increase to 400mg  TID and follow.

## 2019-10-18 NOTE — Progress Notes (Signed)
Called to Pts. Room.  Patient c/o of SOB.  Appears anxious, sitting in recliner.  Vital signs stable with 02 sat of 100%.  No c/o of chest pain.  Dr. Dareen Piano aware and orders received.

## 2019-10-18 NOTE — Progress Notes (Signed)
Patient with elevated BP, Dr. Dareen Piano notified. Will start patient on PO procardia XL in addition to labetalol. Will pull med and treat when approved by pharmacy.

## 2019-10-19 LAB — COMPREHENSIVE METABOLIC PANEL
ALT: 23 U/L (ref 0–44)
AST: 31 U/L (ref 15–41)
Albumin: 2 g/dL — ABNORMAL LOW (ref 3.5–5.0)
Alkaline Phosphatase: 95 U/L (ref 38–126)
Anion gap: 9 (ref 5–15)
BUN: 7 mg/dL (ref 6–20)
CO2: 23 mmol/L (ref 22–32)
Calcium: 8.8 mg/dL — ABNORMAL LOW (ref 8.9–10.3)
Chloride: 106 mmol/L (ref 98–111)
Creatinine, Ser: 0.86 mg/dL (ref 0.44–1.00)
GFR calc Af Amer: >60 mL/min (ref >60)
GFR calc non Af Amer: >60 mL/min (ref >60)
Glucose, Bld: 84 mg/dL (ref 70–99)
Potassium: 4.2 mmol/L (ref 3.5–5.1)
Sodium: 138 mmol/L (ref 135–145)
Total Bilirubin: 0.4 mg/dL (ref 0.3–1.2)
Total Protein: 5.3 g/dL — ABNORMAL LOW (ref 6.5–8.1)

## 2019-10-19 MED ORDER — FUROSEMIDE 20 MG PO TABS
20.0000 mg | ORAL_TABLET | Freq: Every morning | ORAL | 0 refills | Status: DC
Start: 2019-10-19 — End: 2020-12-18

## 2019-10-19 MED ORDER — LABETALOL HCL 300 MG PO TABS: 600.0000 mg | ORAL_TABLET | Freq: Three times a day (TID) | ORAL | 0 refills | Status: DC

## 2019-10-19 MED ORDER — IBUPROFEN 600 MG PO TABS: 600.0000 mg | ORAL_TABLET | Freq: Four times a day (QID) | ORAL | 0 refills | Status: DC

## 2019-10-19 NOTE — Plan of Care (Signed)
Pt has adequately met criteria for discharge. Pt will discharge home with printed instruction. Toya Smothers, RN

## 2019-10-19 NOTE — Lactation Note (Signed)
This note was copied from a baby's chart. Lactation Consultation Note Call received asking for LC to see mom later this morning. Mom wants to sleep and pump later.  Patient Name: Margaret Schmidt [id number] Date: 10/19/2019 Reason for consult: Follow-up assessment;Early term 37-38.6wks   Maternal Data    Feeding    LATCH Score                   Interventions Interventions: DEBP;Breast compression;Breast massage;Hand express;Expressed milk  Lactation Tools Discussed/Used Tools: Pump Breast pump type: Double-Electric Breast Pump   Consult Status Consult Status: Follow-up Date: 10/19/19 Follow-up type: In-patient    Charyl Dancer 10/19/2019, 4:43 AM

## 2019-10-19 NOTE — Lactation Note (Signed)
This note was copied from a baby's chart. Lactation Consultation Note  Patient Name: Margaret Schmidt DHRCB'U Date: 10/19/2019  Used interpreter Eula Listen Id number [id number].  Parents report baby Margaret is Jude.  Mom reports she has not breastfed or pumped since last night.  Mom reports she felt too bad.   Infant with increase in BILI.  Very dry lips.  Under phototheraphy.   Reviewed sheet with recommended amounts on it.  Infant at 80 hours could take much more formula or expressed mothers milk than taking.  Reviewed with dad taking at least 30-60 ml at a feed today. Parents unsure of the last feeding.  Have not been keeping log.  RN starting wrtiing on board today. Gave parents yellow feeding log with instructions on how to use it.  Also encouraged mom to pump at least 8 times in 24 hours for 15 minutes. Mom has hand pump for home use.  Not on [facility]. Do not have DEBP for home use.  Wrote down times mom should pump. Urged parents to feed on cue and at least 8 or more times day as well.  Mom reports breasts and nipples are not sore.  Asked mom if I could show her how to hand express.  Mom in agreement.  Able to get small amount of breastmilk from mom and put it on her finger and showed how to rub on infants lips to help with the dryness. Moms breast slightly full but does not appear milk has come to volume. Discusses putting infant to breast first as well. Then to pump and feed back all expressed mothrem and formula as needed to make the supplement amount given to mom on the sheet.    Maternal Data    Feeding    LATCH Score                   Interventions    Lactation Tools Discussed/Used     Consult Status      Sanyla Summey Michaelle Copas 10/19/2019, 11:19 AM

## 2019-10-19 NOTE — Lactation Note (Signed)
This note was copied from a baby's chart. Lactation Consultation Note Baby is 69 hrs old. Mom is formula feeding. Mom only pumped 1 time yesterday. Encouraged mom to pump every 3 hrs to give baby her BM. Explained mom's milk should be starting to come in. FOB stated yes it's coming in. Breast are not full feeling. LC will work w/mom on pumping. FOB stated they have given some BM they pumped in bottle.  Tech in room. LC will go back when she is finished working w/baby so parents can pay attention to [facility] more.  LC gave information on formula feeding. Discussed to increase amount of milk baby is getting according to hours of age. Parents state understanding.  Patient Name: Margaret Schmidt AEWYB'R Date: 10/19/2019 Reason for consult: Follow-up assessment;Early term 37-38.6wks   Maternal Data    Feeding Feeding Type: Bottle Fed - Formula Nipple Type: Slow - flow  LATCH Score                   Interventions Interventions: DEBP;Breast compression;Breast massage;Hand express;Expressed milk  Lactation Tools Discussed/Used Tools: Pump Breast pump type: Double-Electric Breast Pump   Consult Status Consult Status: Follow-up Date: 10/19/19 Follow-up type: In-patient    Charyl Dancer 10/19/2019, 4:01 AM

## 2019-10-19 NOTE — Progress Notes (Signed)
PPD#3 Pt states that she feels much better this am. She has no SOB, HA. She has had nl B/Ps. Seems that Lasix made the most significant improvement in symptoms and B/P. This am B/P low. Still waiting on CMET.  B/P low this am PLAN/ Will follow b/p this am. If stable will discharge to home after lunch. May need to make adjustments to meds. Will See in office in two days.

## 2019-10-22 NOTE — Discharge Summary (Signed)
Postpartum Discharge Summary      Patient Name: Thresea Doble DOB: 27-Jul-1985 MRN: [id number]  Date of admission: 10/14/2019 Delivery date:10/16/2019  Delivering provider: Waynard Reeds  Date of discharge: 10/22/2019  Admitting diagnosis: Preeclampsia [O14.90] Spontaneous vaginal delivery [O80] Intrauterine pregnancy: [redacted]w[redacted]d     Secondary diagnosis:  Active Problems:   Preeclampsia   Spontaneous vaginal delivery Retained POC   Discharge diagnosis: Preterm Pregnancy Delivered and Preeclampsia (severe)                                                                                Anemia                                       Retained POC Post partum procedure [facility]  Hospital course: Induction of Labor With Vaginal Delivery   34 y.o. yo G1P1001 at [redacted]w[redacted]d was admitted to the hospital 10/14/2019 for induction of labor.  Indication for induction: Gestational hypertension.  Patient had an uncomplicated labor course as follows: Membrane Rupture Time/Date: 10:36 PM ,10/14/2019   Delivery Method:Vaginal, Spontaneous  Episiotomy: Median  Lacerations:  2nd degree  Details of delivery can be found in separate delivery note.  Patient had a routine postpartum course. Patient is discharged home 10/22/19.  Newborn Data: Birth date:10/16/2019  Birth time:3:15 AM  Gender:Female  Living status:Living  Apgars:3 ,9  Weight:2948 g   Magnesium Sulfate received: Yes: Neuroprotection  Vitals:   10/19/19 0354 10/19/19 0634 10/19/19 0730 10/19/19 1130  BP: (!) 142/78 112/65 128/68 (!) 138/97  Pulse: 86 85 82 80  Resp: 17  18 16   Temp: 98.1 F (36.7 C)  97.7 F (36.5 C) 98.1 F (36.7 C)  TempSrc: Oral  Oral Oral  SpO2: 100%  99% 99%  Weight:      Height:       General: alert Lochia: appropriate  Lab Results  Component Value Date   WBC 13.9 (H) 10/17/2019   HGB 7.5 (L) 10/17/2019   HCT 24.3 (L) 10/17/2019   MCV 85.9 10/17/2019   PLT 258 10/17/2019   CMP Latest Ref Rng & Units 10/19/2019   Glucose 70 - 99 mg/dL 84  BUN 6 - 20 mg/dL 7  Creatinine 12/20/2019 - 3.81 mg/dL 8.40  Sodium 3.75 - 436 mmol/L 138  Potassium 3.5 - 5.1 mmol/L 4.2  Chloride 98 - 111 mmol/L 106  CO2 22 - 32 mmol/L 23  Calcium 8.9 - 10.3 mg/dL 067)  Total Protein 6.5 - 8.1 g/dL 5.3(L)  Total Bilirubin 0.3 - 1.2 mg/dL 0.4  Alkaline Phos 38 - 126 U/L 95  AST 15 - 41 U/L 31  ALT 0 - 44 U/L 23      After visit meds:  Allergies as of 10/19/2019   No Known Allergies     Medication List    STOP taking these medications   aspirin 81 MG chewable tablet     TAKE these medications   furosemide 20 MG tablet Commonly known as: Lasix Take 1 tablet (20 mg total) by mouth every morning for 3 doses.   ibuprofen 600  MG tablet Commonly known as: ADVIL Take 1 tablet (600 mg total) by mouth every 6 (six) hours. What changed:   medication strength  how much to take  when to take this  reasons to take this   labetalol 300 MG tablet Commonly known as: NORMODYNE Take 2 tablets (600 mg total) by mouth every 8 (eight) hours.   prenatal multivitamin Tabs tablet Take 1 tablet by mouth daily at 12 noon.        Discharge home in stable condition Discharge instruction: per After Visit Summary and Postpartum booklet. Activity: Advance as tolerated. Pelvic rest for 6 weeks.  Diet: routine diet Appointments:No future appointments. Follow up Visit:  Follow-up Information    Levi Aland, MD. Schedule an appointment as soon as possible for a visit in 2 day(s).   Specialty: Obstetrics and Gynecology Contact information: [address] [place] [address] [phone]                   10/22/2019 Levi Aland, MD

## 2020-04-10 NOTE — L&D Delivery Note (Signed)
Patient was C/C/+1 and pushed for 15 minutes with epidural.    NSVD  female infant, Apgars 8,9, weight P.   The patient had a first degree midline perineal laceration repaired with 2-0 vicryl.  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  This area was not bleeding and the L edge was only slightly raw.  After discussing with the patient and her husband, they elected to leave the area open and not reattached.  I placed two stitches of 3-0 vicryl R on the left edge only to close the raw area,. Fundus was firm. EBL was expected amount. Placenta was delivered intact. Vagina was clear.  Delayed cord clamping done for 30-60 seconds while warming baby. Baby was vigorous and doing skin to skin with mother.  Loney Laurence

## 2020-06-03 LAB — OB RESULTS CONSOLE PLATELET COUNT: Platelets: 312

## 2020-06-03 LAB — OB RESULTS CONSOLE RPR: RPR: NONREACTIVE

## 2020-06-03 LAB — OB RESULTS CONSOLE HIV ANTIBODY (ROUTINE TESTING): HIV: NONREACTIVE

## 2020-06-03 LAB — OB RESULTS CONSOLE GC/CHLAMYDIA
Chlamydia: NEGATIVE
Gonorrhea: NEGATIVE

## 2020-06-03 LAB — OB RESULTS CONSOLE HGB/HCT, BLOOD
HCT: 34 (ref 29–41)
Hemoglobin: 11

## 2020-06-03 LAB — OB RESULTS CONSOLE RUBELLA ANTIBODY, IGM: Rubella: IMMUNE

## 2020-06-03 LAB — OB RESULTS CONSOLE HEPATITIS B SURFACE ANTIGEN: Hepatitis B Surface Ag: NEGATIVE

## 2020-07-29 ENCOUNTER — Other Ambulatory Visit: Payer: Self-pay | Admitting: Obstetrics and Gynecology

## 2020-07-29 DIAGNOSIS — O99212 Obesity complicating pregnancy, second trimester: Secondary | ICD-10-CM

## 2020-07-29 DIAGNOSIS — Z3A19 19 weeks gestation of pregnancy: Secondary | ICD-10-CM

## 2020-08-03 ENCOUNTER — Other Ambulatory Visit: Payer: Self-pay

## 2020-08-06 ENCOUNTER — Encounter: Payer: Self-pay | Admitting: *Deleted

## 2020-08-11 ENCOUNTER — Other Ambulatory Visit: Payer: Self-pay | Admitting: *Deleted

## 2020-08-11 ENCOUNTER — Ambulatory Visit (HOSPITAL_BASED_OUTPATIENT_CLINIC_OR_DEPARTMENT_OTHER): Payer: Medicaid Other

## 2020-08-11 ENCOUNTER — Ambulatory Visit: Payer: Medicaid Other | Attending: Obstetrics and Gynecology | Admitting: *Deleted

## 2020-08-11 ENCOUNTER — Encounter: Payer: Self-pay | Admitting: *Deleted

## 2020-08-11 ENCOUNTER — Other Ambulatory Visit: Payer: Self-pay

## 2020-08-11 VITALS — BP 126/80 | HR 89

## 2020-08-11 DIAGNOSIS — E669 Obesity, unspecified: Secondary | ICD-10-CM | POA: Diagnosis not present

## 2020-08-11 DIAGNOSIS — Z362 Encounter for other antenatal screening follow-up: Secondary | ICD-10-CM

## 2020-08-11 DIAGNOSIS — Z363 Encounter for antenatal screening for malformations: Secondary | ICD-10-CM | POA: Insufficient documentation

## 2020-08-11 DIAGNOSIS — O99212 Obesity complicating pregnancy, second trimester: Secondary | ICD-10-CM | POA: Insufficient documentation

## 2020-08-11 DIAGNOSIS — Z3A19 19 weeks gestation of pregnancy: Secondary | ICD-10-CM | POA: Diagnosis not present

## 2020-08-11 DIAGNOSIS — Z6837 Body mass index (BMI) 37.0-37.9, adult: Secondary | ICD-10-CM

## 2020-08-24 ENCOUNTER — Ambulatory Visit: Payer: Medicaid Other

## 2020-09-09 ENCOUNTER — Ambulatory Visit: Payer: Medicaid Other | Admitting: *Deleted

## 2020-09-09 ENCOUNTER — Encounter: Payer: Self-pay | Admitting: *Deleted

## 2020-09-09 ENCOUNTER — Other Ambulatory Visit: Payer: Self-pay | Admitting: *Deleted

## 2020-09-09 ENCOUNTER — Ambulatory Visit: Payer: Medicaid Other | Attending: Obstetrics and Gynecology

## 2020-09-09 ENCOUNTER — Other Ambulatory Visit: Payer: Self-pay

## 2020-09-09 VITALS — BP 119/83 | HR 85

## 2020-09-09 DIAGNOSIS — Z6837 Body mass index (BMI) 37.0-37.9, adult: Secondary | ICD-10-CM

## 2020-09-09 DIAGNOSIS — Z362 Encounter for other antenatal screening follow-up: Secondary | ICD-10-CM

## 2020-09-14 LAB — OB RESULTS CONSOLE RPR: RPR: NONREACTIVE

## 2020-10-22 ENCOUNTER — Encounter: Payer: Self-pay | Admitting: *Deleted

## 2020-10-22 ENCOUNTER — Other Ambulatory Visit: Payer: Self-pay

## 2020-10-22 ENCOUNTER — Ambulatory Visit: Payer: Medicaid Other | Admitting: *Deleted

## 2020-10-22 ENCOUNTER — Other Ambulatory Visit: Payer: Self-pay | Admitting: *Deleted

## 2020-10-22 ENCOUNTER — Ambulatory Visit: Payer: Medicaid Other | Attending: Obstetrics and Gynecology

## 2020-10-22 VITALS — BP 112/90 | HR 101

## 2020-10-22 DIAGNOSIS — Z8759 Personal history of other complications of pregnancy, childbirth and the puerperium: Secondary | ICD-10-CM

## 2020-10-22 DIAGNOSIS — Z362 Encounter for other antenatal screening follow-up: Secondary | ICD-10-CM

## 2020-10-22 DIAGNOSIS — Z6837 Body mass index (BMI) 37.0-37.9, adult: Secondary | ICD-10-CM

## 2020-10-22 DIAGNOSIS — Z3A32 32 weeks gestation of pregnancy: Secondary | ICD-10-CM

## 2020-10-22 DIAGNOSIS — O99213 Obesity complicating pregnancy, third trimester: Secondary | ICD-10-CM

## 2020-10-22 DIAGNOSIS — O09299 Supervision of pregnancy with other poor reproductive or obstetric history, unspecified trimester: Secondary | ICD-10-CM

## 2020-10-22 DIAGNOSIS — O09293 Supervision of pregnancy with other poor reproductive or obstetric history, third trimester: Secondary | ICD-10-CM | POA: Diagnosis not present

## 2020-10-22 DIAGNOSIS — E669 Obesity, unspecified: Secondary | ICD-10-CM

## 2020-11-11 ENCOUNTER — Ambulatory Visit (INDEPENDENT_AMBULATORY_CARE_PROVIDER_SITE_OTHER): Payer: Self-pay | Admitting: Pediatrics

## 2020-11-11 ENCOUNTER — Other Ambulatory Visit: Payer: Self-pay

## 2020-11-11 DIAGNOSIS — Z7681 Expectant parent(s) prebirth pediatrician visit: Secondary | ICD-10-CM | POA: Insufficient documentation

## 2020-11-11 NOTE — Progress Notes (Signed)
Prenatal counseling for impending newborn done-- Z76.81  

## 2020-11-22 ENCOUNTER — Other Ambulatory Visit: Payer: Self-pay

## 2020-11-22 ENCOUNTER — Ambulatory Visit: Payer: Medicaid Other | Attending: Obstetrics and Gynecology

## 2020-11-22 ENCOUNTER — Ambulatory Visit: Payer: Medicaid Other | Admitting: *Deleted

## 2020-11-22 DIAGNOSIS — O09299 Supervision of pregnancy with other poor reproductive or obstetric history, unspecified trimester: Secondary | ICD-10-CM

## 2020-11-26 LAB — OB RESULTS CONSOLE GBS: GBS: NEGATIVE

## 2020-12-16 ENCOUNTER — Inpatient Hospital Stay (HOSPITAL_COMMUNITY)
Admission: AD | Admit: 2020-12-16 | Discharge: 2020-12-18 | DRG: 807 | Disposition: A | Discharge status: Home / Self Care | Payer: Medicaid Other | Attending: Obstetrics and Gynecology | Admitting: Obstetrics and Gynecology

## 2020-12-16 ENCOUNTER — Inpatient Hospital Stay (HOSPITAL_COMMUNITY): Payer: Medicaid Other | Admitting: Anesthesiology

## 2020-12-16 ENCOUNTER — Encounter (HOSPITAL_COMMUNITY): Payer: Self-pay | Admitting: Obstetrics and Gynecology

## 2020-12-16 ENCOUNTER — Other Ambulatory Visit: Payer: Self-pay

## 2020-12-16 DIAGNOSIS — O99214 Obesity complicating childbirth: Secondary | ICD-10-CM | POA: Diagnosis present

## 2020-12-16 DIAGNOSIS — Z3A40 40 weeks gestation of pregnancy: Secondary | ICD-10-CM | POA: Diagnosis not present

## 2020-12-16 DIAGNOSIS — O133 Gestational [pregnancy-induced] hypertension without significant proteinuria, third trimester: Secondary | ICD-10-CM | POA: Diagnosis present

## 2020-12-16 DIAGNOSIS — Z20822 Contact with and (suspected) exposure to covid-19: Secondary | ICD-10-CM | POA: Diagnosis present

## 2020-12-16 DIAGNOSIS — O134 Gestational [pregnancy-induced] hypertension without significant proteinuria, complicating childbirth: Secondary | ICD-10-CM | POA: Diagnosis present

## 2020-12-16 HISTORY — DX: Female infertility, unspecified: N97.9

## 2020-12-16 LAB — COMPREHENSIVE METABOLIC PANEL
ALT: 15 U/L (ref 0–44)
AST: 17 U/L (ref 15–41)
Albumin: 2.6 g/dL — ABNORMAL LOW (ref 3.5–5.0)
Alkaline Phosphatase: 134 U/L — ABNORMAL HIGH (ref 38–126)
Anion gap: 10 (ref 5–15)
BUN: 8 mg/dL (ref 6–20)
CO2: 19 mmol/L — ABNORMAL LOW (ref 22–32)
Calcium: 8.9 mg/dL (ref 8.9–10.3)
Chloride: 106 mmol/L (ref 98–111)
Creatinine, Ser: 0.6 mg/dL (ref 0.44–1.00)
GFR, Estimated: >60 mL/min (ref >60)
Glucose, Bld: 135 mg/dL — ABNORMAL HIGH (ref 70–99)
Potassium: 3.4 mmol/L — ABNORMAL LOW (ref 3.5–5.1)
Sodium: 135 mmol/L (ref 135–145)
Total Bilirubin: 0.3 mg/dL (ref 0.3–1.2)
Total Protein: 6.3 g/dL — ABNORMAL LOW (ref 6.5–8.1)

## 2020-12-16 LAB — PROTEIN / CREATININE RATIO, URINE
Creatinine, Urine: 228.85 mg/dL
Protein Creatinine Ratio: 0.05 mg/mg{Cre} (ref 0.00–0.15)
Total Protein, Urine: 12 mg/dL

## 2020-12-16 LAB — RESP PANEL BY RT-PCR (FLU A&B, COVID) ARPGX2
Influenza A by PCR: NEGATIVE
Influenza B by PCR: NEGATIVE
SARS Coronavirus 2 by RT PCR: NEGATIVE

## 2020-12-16 LAB — CBC
HCT: 36.7 % (ref 36.0–46.0)
Hemoglobin: 11.9 g/dL — ABNORMAL LOW (ref 12.0–15.0)
MCH: 26.7 pg (ref 26.0–34.0)
MCHC: 32.4 g/dL (ref 30.0–36.0)
MCV: 82.3 fL (ref 80.0–100.0)
Platelets: 261 10*3/uL (ref 150–400)
RBC: 4.46 MIL/uL (ref 3.87–5.11)
RDW: 13.6 % (ref 11.5–15.5)
WBC: 10.4 10*3/uL (ref 4.0–10.5)
nRBC: 0 % (ref 0.0–0.2)

## 2020-12-16 LAB — TYPE AND SCREEN
ABO/RH(D): O NEG
Antibody Screen: NEGATIVE

## 2020-12-16 MED ORDER — OXYTOCIN-SODIUM CHLORIDE 30-0.9 UT/500ML-% IV SOLN
2.5000 [IU]/h | INTRAVENOUS | Status: DC
  Administered 2020-12-17: 2.5 [IU]/h via INTRAVENOUS

## 2020-12-16 MED ORDER — LACTATED RINGERS IV SOLN: 500.0000 mL | INTRAVENOUS | Status: DC | PRN

## 2020-12-16 MED ORDER — ONDANSETRON HCL 4 MG/2ML IJ SOLN
4.0000 mg | Freq: Four times a day (QID) | INTRAMUSCULAR | Status: DC | PRN
  Administered 2020-12-16: 4 mg via INTRAVENOUS
  Filled 2020-12-16: qty 2

## 2020-12-16 MED ORDER — FENTANYL-BUPIVACAINE-NACL 0.5-0.125-0.9 MG/250ML-% EP SOLN
EPIDURAL | Status: DC | PRN
  Administered 2020-12-16: 12 mL/h via EPIDURAL

## 2020-12-16 MED ORDER — SOD CITRATE-CITRIC ACID 500-334 MG/5ML PO SOLN
30.0000 mL | ORAL | Status: DC | PRN
  Administered 2020-12-16: 30 mL via ORAL
  Filled 2020-12-16: qty 30

## 2020-12-16 MED ORDER — OXYCODONE-ACETAMINOPHEN 5-325 MG PO TABS: 2.0000 | ORAL_TABLET | ORAL | Status: DC | PRN

## 2020-12-16 MED ORDER — LIDOCAINE HCL (PF) 1 % IJ SOLN: 30.0000 mL | INTRAMUSCULAR | Status: DC | PRN

## 2020-12-16 MED ORDER — LACTATED RINGERS IV SOLN
500.0000 mL | Freq: Once | INTRAVENOUS | Status: AC
  Administered 2020-12-16: 500 mL via INTRAVENOUS

## 2020-12-16 MED ORDER — LACTATED RINGERS IV SOLN: INTRAVENOUS | Status: DC

## 2020-12-16 MED ORDER — OXYTOCIN-SODIUM CHLORIDE 30-0.9 UT/500ML-% IV SOLN
1.0000 m[IU]/min | INTRAVENOUS | Status: DC
  Administered 2020-12-16: 2 m[IU]/min via INTRAVENOUS
  Filled 2020-12-16 (×2): qty 500

## 2020-12-16 MED ORDER — EPHEDRINE 5 MG/ML INJ: 10.0000 mg | INTRAVENOUS | Status: DC | PRN

## 2020-12-16 MED ORDER — OXYTOCIN BOLUS FROM INFUSION
333.0000 mL | Freq: Once | INTRAVENOUS | Status: AC
  Administered 2020-12-17: 333 mL via INTRAVENOUS

## 2020-12-16 MED ORDER — FENTANYL-BUPIVACAINE-NACL 0.5-0.125-0.9 MG/250ML-% EP SOLN
12.0000 mL/h | EPIDURAL | Status: DC | PRN
  Filled 2020-12-16: qty 250

## 2020-12-16 MED ORDER — DIPHENHYDRAMINE HCL 50 MG/ML IJ SOLN
12.5000 mg | INTRAMUSCULAR | Status: DC | PRN
Start: 2020-12-16 — End: 2020-12-17

## 2020-12-16 MED ORDER — BUTORPHANOL TARTRATE 1 MG/ML IJ SOLN
1.0000 mg | INTRAMUSCULAR | Status: DC | PRN
Start: 2020-12-16 — End: 2020-12-17

## 2020-12-16 MED ORDER — PHENYLEPHRINE 40 MCG/ML (10ML) SYRINGE FOR IV PUSH (FOR BLOOD PRESSURE SUPPORT): 80.0000 ug | PREFILLED_SYRINGE | INTRAVENOUS | Status: DC | PRN

## 2020-12-16 MED ORDER — OXYCODONE-ACETAMINOPHEN 5-325 MG PO TABS: 1.0000 | ORAL_TABLET | ORAL | Status: DC | PRN

## 2020-12-16 MED ORDER — TERBUTALINE SULFATE 1 MG/ML IJ SOLN: 0.2500 mg | Freq: Once | INTRAMUSCULAR | Status: DC | PRN

## 2020-12-16 MED ORDER — LIDOCAINE HCL (PF) 1 % IJ SOLN
INTRAMUSCULAR | Status: DC | PRN
  Administered 2020-12-16: 10 mL via EPIDURAL
  Administered 2020-12-16: 2 mL via EPIDURAL

## 2020-12-16 MED ORDER — ACETAMINOPHEN 325 MG PO TABS
650.0000 mg | ORAL_TABLET | ORAL | Status: DC | PRN
Start: 2020-12-16 — End: 2020-12-17

## 2020-12-16 NOTE — H&P (Signed)
Margaret Schmidt is a 35 y.o. female, G2 P1001, EGA 40+ weeks with EDC 9-6 presenting for induction for PIH.  PNC complicated by NVP early which resolved, maternal obesity, h/o PIH-on baby ASA.  BP in the office yesterday 130/80-90, 140/90 today, no PIH sx.  OB History     Gravida  2   Para  1   Term  1   Preterm      AB      Living  1      SAB      IAB      Ectopic      Multiple  0   Live Births  1          Past Medical History:  Diagnosis Date   Pregnancy induced hypertension    Past Surgical History:  Procedure Laterality Date   DILATION AND CURETTAGE OF UTERUS N/A 10/16/2019   Procedure: DILATATION AND CURETTAGE;  Surgeon: Waynard Reeds, MD;  Location: [facility] LD ORS;  Service: Gynecology;  Laterality: N/A;   NO PAST SURGERIES     Family History: family history is not on file. Social History:  reports that she has never smoked. She has never used smokeless tobacco. She reports that she does not drink alcohol and does not use drugs.     Maternal Diabetes: No Genetic Screening: Declined Maternal Ultrasounds/Referrals: Normal Fetal Ultrasounds or other Referrals:  None Maternal Substance Abuse:  No Significant Maternal Medications:  None Significant Maternal Lab Results:  Group B Strep negative Other Comments:  None  Review of Systems  Respiratory: Negative.    Cardiovascular: Negative.   Maternal Medical History:  Fetal activity: Perceived fetal activity is normal.   Prenatal Complications - Diabetes: none.  Dilation: 2 Effacement (%): 50 Station: Ballotable Exam by:: Lynda Rainwater Rn Blood pressure 136/81, pulse 87, temperature 98.7 F (37.1 C), temperature source Oral, resp. rate 18, height 5\' 6"  (1.676 m), weight 108.9 kg, last menstrual period 03/09/2020, SpO2 100 %, unknown if currently breastfeeding. Maternal Exam:  Uterine Assessment: Contraction strength is mild.  Contraction frequency is irregular.  Abdomen: Patient reports no abdominal  tenderness. Estimated fetal weight is 7.5 lbs.   Fetal presentation: vertex Introitus: Normal vulva. Normal vagina.  Amniotic fluid character: not assessed. Pelvis: adequate for delivery.     Fetal Exam Fetal Monitor Review: Mode: ultrasound.   Baseline rate: 140.  Variability: moderate (6-25 bpm).   Pattern: accelerations present and variable decelerations.   Fetal State Assessment: Category II - tracings are indeterminate.  Physical Exam Vitals reviewed.  Constitutional:      Appearance: Normal appearance.  Cardiovascular:     Rate and Rhythm: Normal rate and regular rhythm.  Pulmonary:     Effort: Pulmonary effort is normal. No respiratory distress.  Abdominal:     Palpations: Abdomen is soft.  Genitourinary:    General: Normal vulva.  Neurological:     Mental Status: She is alert.    Prenatal labs: ABO, Rh: --/--/O NEG (09/08 1016) Antibody: NEG (09/08 1016) Rubella: Immune (02/24 0000) RPR: Nonreactive (06/07 0000)  HBsAg: Negative (02/24 0000)  HIV: Non-reactive (02/24 0000)  GBS: Negative/-- (08/19 0000)   Assessment/Plan: IUP at 40 weeks with PIH, for induction.  Will start pitocin and monitor progress.  She declines female provider, Dr. 03-12-1993 to deliver   Henderson Cloud D Dymphna Wadley 12/16/2020, 12:56 PM

## 2020-12-16 NOTE — Anesthesia Procedure Notes (Signed)
Epidural Patient location during procedure: OB Start time: 12/16/2020 5:43 PM End time: 12/16/2020 5:57 PM  Staffing Anesthesiologist: Lannie Fields, DO Performed: anesthesiologist   Preanesthetic Checklist Completed: patient identified, IV checked, risks and benefits discussed, monitors and equipment checked, pre-op evaluation and timeout performed  Epidural Patient position: sitting Prep: DuraPrep and site prepped and draped Patient monitoring: continuous pulse ox, blood pressure, heart rate and cardiac monitor Approach: midline Location: L3-L4 Injection technique: LOR air  Needle:  Needle type: Tuohy  Needle gauge: 17 G Needle length: 9 cm Needle insertion depth: 8 cm Catheter type: closed end flexible Catheter size: 19 Gauge Catheter at skin depth: 14 cm Test dose: negative  Assessment Sensory level: T8 Events: blood not aspirated, injection not painful, no injection resistance, no paresthesia and negative IV test  Additional Notes Patient identified. Risks/Benefits/Options discussed with patient including but not limited to bleeding, infection, nerve damage, paralysis, failed block, incomplete pain control, headache, blood pressure changes, nausea, vomiting, reactions to medication both or allergic, itching and postpartum back pain. Confirmed with bedside nurse the patient's most recent platelet count. Confirmed with patient that they are not currently taking any anticoagulation, have any bleeding history or any family history of bleeding disorders. Patient expressed understanding and wished to proceed. All questions were answered. Sterile technique was used throughout the entire procedure. Please see nursing notes for vital signs. Test dose was given through epidural catheter and negative prior to continuing to dose epidural or start infusion. Warning signs of high block given to the patient including shortness of breath, tingling/numbness in hands, complete motor block,  or any concerning symptoms with instructions to call for help. Patient was given instructions on fall risk and not to get out of bed. All questions and concerns addressed with instructions to call with any issues or inadequate analgesia.  Reason for block:procedure for pain

## 2020-12-16 NOTE — Anesthesia Preprocedure Evaluation (Signed)
Anesthesia Evaluation  Patient identified by MRN, date of birth, ID band Patient awake    Reviewed: Allergy & Precautions, Patient's Chart, lab work & pertinent test results, reviewed documented beta blocker date and time   Airway Mallampati: II  TM Distance: >3 FB Neck ROM: Full    Dental no notable dental hx.    Pulmonary neg pulmonary ROS,    Pulmonary exam normal breath sounds clear to auscultation       Cardiovascular hypertension (PIH), Normal cardiovascular exam Rhythm:Regular Rate:Normal     Neuro/Psych negative neurological ROS  negative psych ROS   GI/Hepatic negative GI ROS, Neg liver ROS,   Endo/Other  Obesity BMI 39  Renal/GU negative Renal ROS  negative genitourinary   Musculoskeletal negative musculoskeletal ROS (+)   Abdominal (+) + obese,   Peds negative pediatric ROS (+)  Hematology hct 36.7, plt 261   Anesthesia Other Findings   Reproductive/Obstetrics (+) Pregnancy                            Anesthesia Physical Anesthesia Plan  ASA: 2  Anesthesia Plan: Epidural   Post-op Pain Management:    Induction:   PONV Risk Score and Plan: 2  Airway Management Planned: Natural Airway  Additional Equipment: None  Intra-op Plan:   Post-operative Plan:   Informed Consent: I have reviewed the patients History and Physical, chart, labs and discussed the procedure including the risks, benefits and alternatives for the proposed anesthesia with the patient or authorized representative who has indicated his/her understanding and acceptance.       Plan Discussed with:   Anesthesia Plan Comments:         Anesthesia Quick Evaluation

## 2020-12-16 NOTE — Progress Notes (Signed)
1045- Bartko # [id number] interpreter used for admission questions and explaining the plan of care.

## 2020-12-17 LAB — CBC
HCT: 33.6 % — ABNORMAL LOW (ref 36.0–46.0)
Hemoglobin: 11.3 g/dL — ABNORMAL LOW (ref 12.0–15.0)
MCH: 27.6 pg (ref 26.0–34.0)
MCHC: 33.6 g/dL (ref 30.0–36.0)
MCV: 82.2 fL (ref 80.0–100.0)
Platelets: 247 10*3/uL (ref 150–400)
RBC: 4.09 MIL/uL (ref 3.87–5.11)
RDW: 13.8 % (ref 11.5–15.5)
WBC: 15.6 10*3/uL — ABNORMAL HIGH (ref 4.0–10.5)
nRBC: 0 % (ref 0.0–0.2)

## 2020-12-17 LAB — RPR: RPR Ser Ql: NONREACTIVE

## 2020-12-17 MED ORDER — SIMETHICONE 80 MG PO CHEW: 80.0000 mg | CHEWABLE_TABLET | ORAL | Status: DC | PRN

## 2020-12-17 MED ORDER — BENZOCAINE-MENTHOL 20-0.5 % EX AERO
1.0000 "application " | INHALATION_SPRAY | CUTANEOUS | Status: DC | PRN
  Filled 2020-12-17: qty 56

## 2020-12-17 MED ORDER — MAGNESIUM HYDROXIDE 400 MG/5ML PO SUSP: 30.0000 mL | ORAL | Status: DC | PRN

## 2020-12-17 MED ORDER — ONDANSETRON HCL 4 MG PO TABS: 4.0000 mg | ORAL_TABLET | ORAL | Status: DC | PRN

## 2020-12-17 MED ORDER — ACETAMINOPHEN 325 MG PO TABS
650.0000 mg | ORAL_TABLET | ORAL | Status: DC | PRN
  Administered 2020-12-17 – 2020-12-18 (×4): 650 mg via ORAL
  Filled 2020-12-17 (×3): qty 2

## 2020-12-17 MED ORDER — DIBUCAINE (PERIANAL) 1 % EX OINT: 1.0000 "application " | TOPICAL_OINTMENT | CUTANEOUS | Status: DC | PRN

## 2020-12-17 MED ORDER — FERROUS SULFATE 325 (65 FE) MG PO TABS
325.0000 mg | ORAL_TABLET | Freq: Two times a day (BID) | ORAL | Status: DC
  Administered 2020-12-17 (×2): 325 mg via ORAL
  Filled 2020-12-17 (×2): qty 1

## 2020-12-17 MED ORDER — METHYLERGONOVINE MALEATE 0.2 MG PO TABS: 0.2000 mg | ORAL_TABLET | ORAL | Status: DC | PRN

## 2020-12-17 MED ORDER — IBUPROFEN 800 MG PO TABS
800.0000 mg | ORAL_TABLET | Freq: Three times a day (TID) | ORAL | Status: DC
  Administered 2020-12-17 – 2020-12-18 (×4): 800 mg via ORAL
  Filled 2020-12-17 (×4): qty 1

## 2020-12-17 MED ORDER — SODIUM CHLORIDE 0.9% FLUSH
3.0000 mL | Freq: Two times a day (BID) | INTRAVENOUS | Status: DC
  Administered 2020-12-17: 3 mL via INTRAVENOUS

## 2020-12-17 MED ORDER — SENNOSIDES-DOCUSATE SODIUM 8.6-50 MG PO TABS
2.0000 | ORAL_TABLET | Freq: Every day | ORAL | Status: DC
  Administered 2020-12-18: 2 via ORAL
  Filled 2020-12-17: qty 2

## 2020-12-17 MED ORDER — OXYCODONE-ACETAMINOPHEN 5-325 MG PO TABS
2.0000 | ORAL_TABLET | ORAL | Status: DC | PRN
Start: 2020-12-17 — End: 2020-12-18

## 2020-12-17 MED ORDER — PRENATAL MULTIVITAMIN CH
1.0000 | ORAL_TABLET | Freq: Every day | ORAL | Status: DC
  Administered 2020-12-17 – 2020-12-18 (×2): 1 via ORAL
  Filled 2020-12-17 (×2): qty 1

## 2020-12-17 MED ORDER — ZOLPIDEM TARTRATE 5 MG PO TABS: 5.0000 mg | ORAL_TABLET | Freq: Every evening | ORAL | Status: DC | PRN

## 2020-12-17 MED ORDER — COCONUT OIL OIL: 1.0000 "application " | TOPICAL_OIL | Status: DC | PRN

## 2020-12-17 MED ORDER — METHYLERGONOVINE MALEATE 0.2 MG/ML IJ SOLN
0.2000 mg | INTRAMUSCULAR | Status: DC | PRN
Start: 2020-12-17 — End: 2020-12-18

## 2020-12-17 MED ORDER — TETANUS-DIPHTH-ACELL PERTUSSIS 5-2.5-18.5 LF-MCG/0.5 IM SUSY: 0.5000 mL | PREFILLED_SYRINGE | Freq: Once | INTRAMUSCULAR | Status: DC

## 2020-12-17 MED ORDER — MEASLES, MUMPS & RUBELLA VAC IJ SOLR: 0.5000 mL | Freq: Once | INTRAMUSCULAR | Status: DC

## 2020-12-17 MED ORDER — SODIUM CHLORIDE 0.9% FLUSH: 3.0000 mL | INTRAVENOUS | Status: DC | PRN

## 2020-12-17 MED ORDER — SODIUM CHLORIDE 0.9 % IV SOLN: 250.0000 mL | INTRAVENOUS | Status: DC | PRN

## 2020-12-17 MED ORDER — DIPHENHYDRAMINE HCL 25 MG PO CAPS: 25.0000 mg | ORAL_CAPSULE | Freq: Four times a day (QID) | ORAL | Status: DC | PRN

## 2020-12-17 MED ORDER — WITCH HAZEL-GLYCERIN EX PADS: 1.0000 "application " | MEDICATED_PAD | CUTANEOUS | Status: DC | PRN

## 2020-12-17 MED ORDER — ONDANSETRON HCL 4 MG/2ML IJ SOLN: 4.0000 mg | INTRAMUSCULAR | Status: DC | PRN

## 2020-12-17 NOTE — Lactation Note (Signed)
This note was copied from a baby's chart. Lactation Consultation Note  Patient Name: Margaret Schmidt VWPVX'Y Date: 12/17/2020 Reason for consult: Initial assessment;Term Age:34 hours   P2 mother whose infant is now 58 hours old.  This is a term baby at 40+3 weeks.  Mother's feeding preference is breast/formula.  Mother breast fed her first child for 14 months until she became pregnant with this baby.  Arabic interpreter, Tempie Donning [phone]) used for interpretation.  Baby was swaddled and asleep in the bassinet when I arrived.  Mother had no immediate questions/concerns related to breast feeding.  Baby began to arouse and spit a small amount of amniotic fluid.  Burped multiple times.  Mother interested in attempting to latch.  Attempted to latch in the cradle hold, per mother's request, however baby was too sleepy once she burped.  Placed her STS where she fell asleep.  Mother will feed 8-12 times/24 hours or sooner if baby shows feeding cues.  She will call for latch assistance as needed.  Father present and asleep on the couch.   Maternal Data Has patient been taught Hand Expression?: Yes Does the patient have breastfeeding experience prior to this delivery?: Yes How long did the patient breastfeed?: 14 months  Feeding Mother's Current Feeding Choice: Breast Milk and Formula Nipple Type: Extra Slow Flow  LATCH Score Latch: Too sleepy or reluctant, no latch achieved, no sucking elicited.  Audible Swallowing: None  Type of Nipple: Everted at rest and after stimulation  Comfort (Breast/Nipple): Soft / non-tender  Hold (Positioning): Assistance needed to correctly position infant at breast and maintain latch.  LATCH Score: 5   Lactation Tools Discussed/Used    Interventions Interventions: Breast feeding basics reviewed;Assisted with latch;Skin to skin;Adjust position;Education  Discharge    Consult Status Consult Status: Follow-up Date: 12/18/20 Follow-up type:  In-patient    Chanell Nadeau R Yosiel Thieme 12/17/2020, 8:57 AM

## 2020-12-17 NOTE — Anesthesia Postprocedure Evaluation (Signed)
Anesthesia Post Note  Patient: Jocilynn Prophete  Procedure(s) Performed: AN AD HOC LABOR EPIDURAL     Patient location during evaluation: Mother Baby Anesthesia Type: Epidural Level of consciousness: awake and alert Pain management: pain level controlled Vital Signs Assessment: post-procedure vital signs reviewed and stable Respiratory status: spontaneous breathing, nonlabored ventilation and respiratory function stable Cardiovascular status: stable Postop Assessment: no headache, no backache, epidural receding, no apparent nausea or vomiting, patient able to bend at knees, adequate PO intake and able to ambulate Anesthetic complications: no   No notable events documented.  Last Vitals:  Vitals:   12/17/20 0745 12/17/20 1245  BP: 128/79 120/80  Pulse: 65 68  Resp: 17 16  Temp: 37.2 C 37.1 C  SpO2: 100% 100%    Last Pain:  Vitals:   12/17/20 1440  TempSrc:   PainSc: 5    Pain Goal: Patients Stated Pain Goal: 3 (12/17/20 1440)                 Lulubelle Simcoe Hristova

## 2020-12-18 MED ORDER — ACETAMINOPHEN 325 MG PO TABS: 650.0000 mg | ORAL_TABLET | ORAL | 0 refills | Status: DC | PRN

## 2020-12-18 MED ORDER — IBUPROFEN 800 MG PO TABS: 800.0000 mg | ORAL_TABLET | Freq: Three times a day (TID) | ORAL | 0 refills | Status: DC

## 2020-12-18 NOTE — Progress Notes (Signed)
Post Partum Day 1 Subjective: no complaints, up ad lib, and voiding.  Requests d/c home  Objective: Blood pressure 124/80, pulse 81, temperature 98.3 F (36.8 C), temperature source Oral, resp. rate 16, height 5\' 6"  (1.676 m), weight 108.9 kg, last menstrual period 03/09/2020, SpO2 100 %, unknown if currently breastfeeding.  Physical Exam:  General: alert and cooperative Lochia: appropriate Uterine Fundus: firm   Recent Labs    12/16/20 1016 12/17/20 0742  HGB 11.9* 11.3*  HCT 36.7 33.6*    Assessment/Plan: Discharge home All blood pressures WNL postpartum  LOS: 2 days   02/16/21 12/18/2020, 11:08 AM

## 2020-12-18 NOTE — Lactation Note (Signed)
This note was copied from a baby's chart. Lactation Consultation Note  Patient Name: Margaret Schmidt WGNFA'O Date: 12/18/2020 Reason for consult: Follow-up assessment;Term;Primapara;1st time breastfeeding Age:34 hours   P1 mother whose infant is now 60 hours old.  This is a term baby at 40+3 weeks.  Mother's feeding preference is breast/formula.  Arabic interpreter, [name],  [phone]) used for interpretation.  Baby was swaddled and asleep when I arrived.  Mother has been breast feeding well on the left breast, but is having some difficulty with positioning on the right breast.  Mother is supplementing with formula.  Since baby has recently fed I suggested mother call me back for assistance with the next feeding.  Mother is interested.  Will provide further education when I return to assist.    Father present.   Maternal Data Has patient been taught Hand Expression?: Yes Does the patient have breastfeeding experience prior to this delivery?: No  Feeding Mother's Current Feeding Choice: Breast Milk and Formula Nipple Type: Slow - flow  LATCH Score                    Lactation Tools Discussed/Used    Interventions Interventions: Education  Discharge    Consult Status Consult Status: Follow-up Date: 12/18/20 Follow-up type: In-patient    [name] R Nasser Ku 12/18/2020, 8:45 AM

## 2020-12-18 NOTE — Lactation Note (Signed)
This note was copied from a baby's chart. Lactation Consultation Note  Patient Name: Margaret Schmidt FVCBS'W Date: 12/18/2020 Reason for consult: Follow-up assessment;Term;Primapara;1st time breastfeeding Age:34 hours   P1 mother whose infant is now 26 hours old.  This is a term baby at 40+3 weeks.  Mother's feeding preference is breast/formula.  Arabic interpreter, Lafonda Mosses [phone]) used for interpretation.  Mother requested latch assistance on her right breast.  She has been having some difficulty with latching on that side only.  Assisted to latch easily in the football hold.  Mother taught appropriate hand and finger placement with good breast support.  Demonstrated gentle stimulation to keep baby actively engaged with feeding.  Mother asked questions via the interpreter which I answered to her satisfaction.  Father in room near the end of the feeding and I showed him how he can assist mother after discharge.  Mother will continue to feed on cue.  She asked about formula supplementation and I advised lots of breast feeding to help ensure a good milk supply.  Discussed milk "coming to volume."  Mother denied pain with feeding and felt uterine contractions.    Mother has a manual pump for home use.  She has our OP phone number for any questions/concerns after discharge.   Maternal Data Has patient been taught Hand Expression?: Yes Does the patient have breastfeeding experience prior to this delivery?: No  Feeding Mother's Current Feeding Choice: Breast Milk and Formula  LATCH Score Latch: Grasps breast easily, tongue down, lips flanged, rhythmical sucking.  Audible Swallowing: A few with stimulation  Type of Nipple: Everted at rest and after stimulation  Comfort (Breast/Nipple): Soft / non-tender  Hold (Positioning): Assistance needed to correctly position infant at breast and maintain latch.  LATCH Score: 8   Lactation Tools Discussed/Used     Interventions Interventions: Breast feeding basics reviewed;Assisted with latch;Skin to skin;Breast massage;Breast compression;Adjust position;Support pillows;Position options;Education  Discharge Discharge Education: Engorgement and breast care Pump: Manual (For home use)  Consult Status Consult Status: Complete Date: 12/18/20 Follow-up type: Call as needed    Margaret Schmidt 12/18/2020, 11:30 AM

## 2020-12-18 NOTE — Discharge Summary (Signed)
Postpartum Discharge Summary       Patient Name: Margaret Schmidt DOB: 13-Sep-1985 MRN: [id number]  Date of admission: 12/16/2020 Delivery date:12/17/2020  Delivering provider: Carrington Clamp  Date of discharge: 12/18/2020  Admitting diagnosis: PIH (pregnancy induced hypertension), third trimester [O13.3] Intrauterine pregnancy: [redacted]w[redacted]d     Secondary diagnosis:  Active Problems:   PIH (pregnancy induced hypertension), third trimester  Additional problems: none    Discharge diagnosis: Term Pregnancy Delivered                                              Post partum procedures: none Augmentation: Pitocin Complications: None  Hospital course: Induction of Labor With Vaginal Delivery   35 y.o. yo G2P1001 at [redacted]w[redacted]d was admitted to the hospital 12/16/2020 for induction of labor.  Indication for induction: Gestational hypertension.  Patient had an uncomplicated labor course as follows: Membrane Rupture Time/Date: 1:00 AM ,12/17/2020   Delivery Method:Vaginal, Spontaneous  Episiotomy: None  Lacerations:  1st degree;Perineal  Details of delivery can be found in separate delivery note.  Patient had a routine postpartum course and all her blood pressures post-delivery were normal.  Patient is discharged home 12/18/20.  Newborn Data: Birth date:12/17/2020  Birth time:4:18 AM  Gender:Female  Living status:Living  Apgars:8 ,9  Weight:3873 g      Physical exam  Vitals:   12/17/20 1245 12/17/20 1655 12/17/20 1935 12/18/20 0450  BP: 120/80 135/78 133/77 124/80  Pulse: 68 82 80 81  Resp: 16 16 16 16   Temp: 98.8 F (37.1 C) 98.2 F (36.8 C) 98 F (36.7 C) 98.3 F (36.8 C)  TempSrc: Oral Oral Oral Oral  SpO2: 100% 100% 99% 100%  Weight:      Height:       General: alert and cooperative Lochia: appropriate Uterine Fundus: firm  Labs: Lab Results  Component Value Date   WBC 15.6 (H) 12/17/2020   HGB 11.3 (L) 12/17/2020   HCT 33.6 (L) 12/17/2020   MCV 82.2 12/17/2020   PLT 247  12/17/2020   CMP Latest Ref Rng & Units 12/16/2020  Glucose 70 - 99 mg/dL 02/15/2021)  BUN 6 - 20 mg/dL 8  Creatinine 349(Z - 7.91 mg/dL 5.05  Sodium 6.97 - 948 mmol/L 135  Potassium 3.5 - 5.1 mmol/L 3.4(L)  Chloride 98 - 111 mmol/L 106  CO2 22 - 32 mmol/L 19(L)  Calcium 8.9 - 10.3 mg/dL 8.9  Total Protein 6.5 - 8.1 g/dL 6.3(L)  Total Bilirubin 0.3 - 1.2 mg/dL 0.3  Alkaline Phos 38 - 126 U/L 134(H)  AST 15 - 41 U/L 17  ALT 0 - 44 U/L 15   Edinburgh Score: Edinburgh Postnatal Depression Scale Screening Tool 12/17/2020  I have been able to laugh and see the funny side of things. 0  I have looked forward with enjoyment to things. 0  I have blamed myself unnecessarily when things went wrong. 0  I have been anxious or worried for no good reason. 0  I have felt scared or panicky for no good reason. 0  Things have been getting on top of me. 0  I have been so unhappy that I have had difficulty sleeping. 0  I have felt sad or miserable. 0  I have been so unhappy that I have been crying. 0  The thought of harming myself has occurred to me. 0  Edinburgh Postnatal Depression Scale Total 0     After visit meds:  Allergies as of 12/18/2020   No Known Allergies      Medication List     STOP taking these medications    aspirin EC 81 MG tablet   furosemide 20 MG tablet Commonly known as: Lasix   labetalol 300 MG tablet Commonly known as: NORMODYNE       TAKE these medications    acetaminophen 325 MG tablet Commonly known as: Tylenol Take 2 tablets (650 mg total) by mouth every 4 (four) hours as needed (for pain scale < 4).   ibuprofen 800 MG tablet Commonly known as: ADVIL Take 1 tablet (800 mg total) by mouth 3 (three) times daily. What changed:  medication strength how much to take when to take this   prenatal multivitamin Tabs tablet Take 1 tablet by mouth daily at 12 noon.         Discharge home in stable condition Infant Feeding: Breast Infant Disposition:home  with mother Discharge instruction: per After Visit Summary and Postpartum booklet. Activity: Advance as tolerated. Pelvic rest for 6 weeks.  Diet: routine diet Future Appointments:No future appointments. Follow up Visit:  Follow-up Information     Carrington Clamp, MD Follow up in 4 week(s).   Specialty: Obstetrics and Gynecology Why: Postpartum exam Contact information: [address] [place] [address] [phone]                  Please schedule this patient for a In person postpartum visit in 4 weeks with the following provider: MD.  Delivery mode:  Vaginal, Spontaneous  Anticipated Birth Control:  Unsure   12/18/2020 Oliver Pila, MD

## 2020-12-30 ENCOUNTER — Telehealth (HOSPITAL_COMMUNITY): Payer: Self-pay | Admitting: *Deleted

## 2020-12-30 NOTE — Telephone Encounter (Signed)
Interpreter reports mom feels fine. No concerns about herself. EPDS=0 (hospital score=0) Interpreter reports baby is well. No concerns about baby.  Duffy Rhody, RN 12-30-2020 at 2:20pm

## 2021-01-22 ENCOUNTER — Encounter (HOSPITAL_COMMUNITY): Payer: Self-pay | Admitting: Obstetrics and Gynecology

## 2021-01-22 ENCOUNTER — Inpatient Hospital Stay (HOSPITAL_COMMUNITY): Payer: Medicaid Other | Admitting: Certified Registered Nurse Anesthetist

## 2021-01-22 ENCOUNTER — Encounter (HOSPITAL_COMMUNITY)
Admission: AD | Disposition: A | Discharge status: Home / Self Care | Payer: Self-pay | Source: Home / Self Care | Attending: Obstetrics and Gynecology

## 2021-01-22 ENCOUNTER — Inpatient Hospital Stay (HOSPITAL_COMMUNITY): Payer: Medicaid Other

## 2021-01-22 ENCOUNTER — Inpatient Hospital Stay (HOSPITAL_COMMUNITY)
Admission: AD | Admit: 2021-01-22 | Discharge: 2021-01-22 | Disposition: A | Discharge status: Home / Self Care | Payer: Medicaid Other | Attending: Obstetrics and Gynecology | Admitting: Obstetrics and Gynecology

## 2021-01-22 ENCOUNTER — Other Ambulatory Visit: Payer: Self-pay

## 2021-01-22 DIAGNOSIS — N939 Abnormal uterine and vaginal bleeding, unspecified: Secondary | ICD-10-CM

## 2021-01-22 DIAGNOSIS — Z789 Other specified health status: Secondary | ICD-10-CM

## 2021-01-22 HISTORY — PX: DILATION AND EVACUATION: SHX1459

## 2021-01-22 LAB — CBC
HCT: 36.4 % (ref 36.0–46.0)
Hemoglobin: 11.8 g/dL — ABNORMAL LOW (ref 12.0–15.0)
MCH: 26.4 pg (ref 26.0–34.0)
MCHC: 32.4 g/dL (ref 30.0–36.0)
MCV: 81.4 fL (ref 80.0–100.0)
Platelets: 293 10*3/uL (ref 150–400)
RBC: 4.47 MIL/uL (ref 3.87–5.11)
RDW: 13 % (ref 11.5–15.5)
WBC: 9.4 10*3/uL (ref 4.0–10.5)
nRBC: 0 % (ref 0.0–0.2)

## 2021-01-22 LAB — TYPE AND SCREEN
ABO/RH(D): O NEG
Antibody Screen: NEGATIVE

## 2021-01-22 LAB — HCG, QUANTITATIVE, PREGNANCY: hCG, Beta Chain, Quant, S: 1 m[IU]/mL (ref <5)

## 2021-01-22 SURGERY — DILATION AND CURETTAGE
Anesthesia: General

## 2021-01-22 SURGERY — DILATION AND EVACUATION, UTERUS
Anesthesia: General

## 2021-01-22 MED ORDER — LACTATED RINGERS IV SOLN: INTRAVENOUS | Status: DC

## 2021-01-22 MED ORDER — CHLORHEXIDINE GLUCONATE 0.12 % MT SOLN
OROMUCOSAL | Status: AC
  Administered 2021-01-22: 15 mL via OROMUCOSAL
  Filled 2021-01-22: qty 15

## 2021-01-22 MED ORDER — SUGAMMADEX SODIUM 200 MG/2ML IV SOLN
INTRAVENOUS | Status: DC | PRN
  Administered 2021-01-22: 300 mg via INTRAVENOUS

## 2021-01-22 MED ORDER — FENTANYL CITRATE (PF) 250 MCG/5ML IJ SOLN
INTRAMUSCULAR | Status: DC | PRN
  Administered 2021-01-22: 100 ug via INTRAVENOUS

## 2021-01-22 MED ORDER — FENTANYL CITRATE (PF) 250 MCG/5ML IJ SOLN
INTRAMUSCULAR | Status: AC
  Filled 2021-01-22: qty 5

## 2021-01-22 MED ORDER — LACTATED RINGERS IV BOLUS
1000.0000 mL | Freq: Once | INTRAVENOUS | Status: AC
  Administered 2021-01-22: 1000 mL via INTRAVENOUS

## 2021-01-22 MED ORDER — ROCURONIUM BROMIDE 10 MG/ML (PF) SYRINGE
PREFILLED_SYRINGE | INTRAVENOUS | Status: DC | PRN
  Administered 2021-01-22: 60 mg via INTRAVENOUS

## 2021-01-22 MED ORDER — METOCLOPRAMIDE HCL 5 MG/ML IJ SOLN
5.0000 mg | Freq: Once | INTRAMUSCULAR | Status: AC
  Administered 2021-01-22: 5 mg via INTRAVENOUS

## 2021-01-22 MED ORDER — IBUPROFEN 800 MG PO TABS: 800.0000 mg | ORAL_TABLET | Freq: Three times a day (TID) | ORAL | 1 refills | Status: DC | PRN

## 2021-01-22 MED ORDER — ONDANSETRON HCL 4 MG/2ML IJ SOLN
INTRAMUSCULAR | Status: AC
  Filled 2021-01-22: qty 2

## 2021-01-22 MED ORDER — ORAL CARE MOUTH RINSE: 15.0000 mL | Freq: Once | OROMUCOSAL | Status: AC

## 2021-01-22 MED ORDER — 0.9 % SODIUM CHLORIDE (POUR BTL) OPTIME
TOPICAL | Status: DC | PRN
  Administered 2021-01-22: 1000 mL

## 2021-01-22 MED ORDER — LIDOCAINE HCL 1 % IJ SOLN
INTRAMUSCULAR | Status: AC
  Filled 2021-01-22: qty 20

## 2021-01-22 MED ORDER — DOXYCYCLINE HYCLATE 100 MG IV SOLR
200.0000 mg | Freq: Once | INTRAVENOUS | Status: AC
  Administered 2021-01-22: 200 mg via INTRAVENOUS
  Filled 2021-01-22: qty 200

## 2021-01-22 MED ORDER — LIDOCAINE 2% (20 MG/ML) 5 ML SYRINGE
INTRAMUSCULAR | Status: DC | PRN
  Administered 2021-01-22: 100 mg via INTRAVENOUS

## 2021-01-22 MED ORDER — FENTANYL CITRATE (PF) 100 MCG/2ML IJ SOLN: 25.0000 ug | INTRAMUSCULAR | Status: DC | PRN

## 2021-01-22 MED ORDER — DEXAMETHASONE SODIUM PHOSPHATE 10 MG/ML IJ SOLN
INTRAMUSCULAR | Status: DC | PRN
  Administered 2021-01-22: 5 mg via INTRAVENOUS

## 2021-01-22 MED ORDER — PROPOFOL 10 MG/ML IV BOLUS
INTRAVENOUS | Status: DC | PRN
  Administered 2021-01-22: 170 mg via INTRAVENOUS

## 2021-01-22 MED ORDER — CHLORHEXIDINE GLUCONATE 0.12 % MT SOLN: 15.0000 mL | Freq: Once | OROMUCOSAL | Status: AC

## 2021-01-22 MED ORDER — ONDANSETRON HCL 4 MG/2ML IJ SOLN
INTRAMUSCULAR | Status: DC | PRN
  Administered 2021-01-22: 4 mg via INTRAVENOUS

## 2021-01-22 MED ORDER — PHENYLEPHRINE 40 MCG/ML (10ML) SYRINGE FOR IV PUSH (FOR BLOOD PRESSURE SUPPORT)
PREFILLED_SYRINGE | INTRAVENOUS | Status: AC
  Filled 2021-01-22: qty 10

## 2021-01-22 MED ORDER — MIDAZOLAM HCL 2 MG/2ML IJ SOLN
INTRAMUSCULAR | Status: DC | PRN
  Administered 2021-01-22: 2 mg via INTRAVENOUS

## 2021-01-22 MED ORDER — MIDAZOLAM HCL 2 MG/2ML IJ SOLN
INTRAMUSCULAR | Status: AC
  Filled 2021-01-22: qty 2

## 2021-01-22 MED ORDER — LIDOCAINE HCL 2 % IJ SOLN
INTRAMUSCULAR | Status: DC | PRN
  Administered 2021-01-22: 10 mL

## 2021-01-22 MED ORDER — METOCLOPRAMIDE HCL 5 MG/ML IJ SOLN
INTRAMUSCULAR | Status: AC
  Filled 2021-01-22: qty 2

## 2021-01-22 MED ORDER — PROPOFOL 10 MG/ML IV BOLUS
INTRAVENOUS | Status: AC
  Filled 2021-01-22: qty 20

## 2021-01-22 SURGICAL SUPPLY — 24 items
CATH ROBINSON RED A/P 16FR (CATHETERS) ×4 IMPLANT
CNTNR URN SCR LID CUP LEK RST (MISCELLANEOUS) ×1 IMPLANT
CONT SPEC 4OZ STRL OR WHT (MISCELLANEOUS) ×1
DECANTER SPIKE VIAL GLASS SM (MISCELLANEOUS) ×2 IMPLANT
FILTER UTR ASPR ASSEMBLY (MISCELLANEOUS) ×2 IMPLANT
GLOVE SURG ENC MOIS LTX SZ6 (GLOVE) ×6 IMPLANT
GLOVE SURG UNDER POLY LF SZ6.5 (GLOVE) ×4 IMPLANT
GLOVE SURG UNDER POLY LF SZ7 (GLOVE) ×4 IMPLANT
GOWN STRL REUS W/ TWL LRG LVL3 (GOWN DISPOSABLE) ×4 IMPLANT
GOWN STRL REUS W/TWL LRG LVL3 (GOWN DISPOSABLE) ×4
HOSE CONNECTING 18IN BERKELEY (TUBING) ×2 IMPLANT
KIT BERKELEY 1ST TRI 3/8 NO TR (MISCELLANEOUS) ×2 IMPLANT
KIT BERKELEY 1ST TRIMESTER 3/8 (MISCELLANEOUS) ×2 IMPLANT
KIT TURNOVER KIT B (KITS) ×2 IMPLANT
NS IRRIG 1000ML POUR BTL (IV SOLUTION) ×2 IMPLANT
PACK VAGINAL MINOR WOMEN LF (CUSTOM PROCEDURE TRAY) ×4 IMPLANT
PAD OB MATERNITY 4.3X12.25 (PERSONAL CARE ITEMS) ×4 IMPLANT
SET BERKELEY SUCTION TUBING (SUCTIONS) ×2 IMPLANT
TOWEL GREEN STERILE FF (TOWEL DISPOSABLE) ×4 IMPLANT
UNDERPAD 30X36 HEAVY ABSORB (UNDERPADS AND DIAPERS) ×2 IMPLANT
VACURETTE 10 RIGID CVD (CANNULA) IMPLANT
VACURETTE 7MM CVD STRL WRAP (CANNULA) IMPLANT
VACURETTE 8 RIGID CVD (CANNULA) ×2 IMPLANT
VACURETTE 9 RIGID CVD (CANNULA) IMPLANT

## 2021-01-22 NOTE — MAU Note (Signed)
Vag del 9/9.  10 days ago, started having very heavy bleeding with clots, yesterday was so heavy it dripped on the floor.  Saw dr  2 days ago.  Was told it could be her period, blood work was done and has appt for yesterday.  Wearing pads, bleeding not continuous, will be heavy for 15 min, changing every 5 min... then slows. Bleeding did not stop since birth, but was minimal until 10 days ago.

## 2021-01-22 NOTE — MAU Provider Note (Addendum)
History     CSN: [id number]  Arrival date and time: 01/22/21 1041   Event Date/Time   First Provider Initiated Contact with Patient 01/22/21 1119      Chief Complaint  Patient presents with   Vaginal Bleeding   [name] is a 35 y.o. year old G63P1001 female at 5 weeks PP who presents to MAU reporting vaginal bleeding that continued on since delivery. She had a vaginal delivery on 12/17/2020; with no complications. She reports that her bleeding never completely stopped, but did lighten up until 10 days ago. Ten days ago she started having heavy vaginal bleeding with lemon sized blood clots that would gush for about 15 minutes at a time and changing pads every 5 minutes and then lighten up again. She reports she was in her OB's office with this same complaint 2 days ago. She has a h/o surgical removal of placenta due to an avulsed umbilical cord and failed attempt at manual extraction of placenta in 2021. Her husband is present and contributing to the history taking.    OB History     Gravida  2   Para  1   Term  1   Preterm      AB      Living  1      SAB      IAB      Ectopic      Multiple  0   Live Births  1           Past Medical History:  Diagnosis Date   Infertility, female    Pregnancy induced hypertension     Past Surgical History:  Procedure Laterality Date   DILATION AND CURETTAGE OF UTERUS N/A 10/16/2019   Procedure: DILATATION AND CURETTAGE;  Surgeon: Waynard Reeds, MD;  Location: [facility];  Service: Gynecology;  Laterality: N/A;    Family History  Problem Relation Age of Onset   Cancer Neg Hx    Diabetes Neg Hx    Heart disease Neg Hx     Social History   Tobacco Use   Smoking status: Never   Smokeless tobacco: Never  Vaping Use   Vaping Use: Never used  Substance Use Topics   Alcohol use: Never   Drug use: Never    Allergies: No Known Allergies  Medications Prior to Admission  Medication Sig Dispense Refill Last  Dose   acetaminophen (TYLENOL) 325 MG tablet Take 2 tablets (650 mg total) by mouth every 4 (four) hours as needed (for pain scale < 4). 30 tablet 0    ibuprofen (ADVIL) 800 MG tablet Take 1 tablet (800 mg total) by mouth 3 (three) times daily. 30 tablet 0    Prenatal Vit-Fe Fumarate-FA (PRENATAL MULTIVITAMIN) TABS tablet Take 1 tablet by mouth daily at 12 noon.       Review of Systems  Constitutional: Negative.   HENT: Negative.    Eyes: Negative.   Respiratory: Negative.    Cardiovascular: Negative.   Gastrointestinal: Negative.   Endocrine: Negative.   Genitourinary:  Positive for vaginal bleeding (intermittent, heavy vaginal bleeding with clots).  Musculoskeletal: Negative.   Skin: Negative.   Allergic/Immunologic: Negative.   Neurological: Negative.   Hematological: Negative.   Psychiatric/Behavioral: Negative.    Physical Exam   Blood pressure 137/83, pulse 79, temperature 98.2 F (36.8 C), temperature source Oral, resp. rate 18, SpO2 100 %, currently breastfeeding. Orthostatic VS: Lying = 119/82, 76  Sitting = 129/85, 83  Standing =  123/87, 103  Physical Exam Vitals and nursing note reviewed. Exam conducted with a chaperone present.  Constitutional:      Appearance: Normal appearance. She is obese.  Cardiovascular:     Rate and Rhythm: Normal rate.  Pulmonary:     Effort: Pulmonary effort is normal.  Abdominal:     Palpations: Abdomen is soft.  Genitourinary:    Comments: Small amount of dark red blood with dark, red blood clot in cervical os that has appearance of retained POC Musculoskeletal:        General: Normal range of motion.  Skin:    General: Skin is warm and dry.  Neurological:     Mental Status: She is alert and oriented to person, place, and time.  Psychiatric:        Mood and Affect: Mood normal.        Behavior: Behavior normal.        Thought Content: Thought content normal.        Judgment: Judgment normal.    MAU Course   Procedures  MDM CBC HCG T&S Pelvis complete U/S  *Consult with Dr. Shawnie Pons @ [phone] - notified of patient's complaints, assessments, lab & U/S results, recommended tx plan call Dr. Reina Fuse with the recommendations of either surgical treatment or Cytotec  Dr.Shivaji notified @ 1410 - options to discuss with patient  Dr. Reina Fuse notified of patient and spouse's decision for surgical treatment - orders received to prep for OR  Results for orders placed or performed during the hospital encounter of 01/22/21 (from the past 24 hour(s))  CBC     Status: Abnormal   Collection Time: 01/22/21 11:19 AM  Result Value Ref Range   WBC 9.4 4.0 - 10.5 K/uL   RBC 4.47 3.87 - 5.11 MIL/uL   Hemoglobin 11.8 (L) 12.0 - 15.0 g/dL   HCT 88.4 16.6 - 06.3 %   MCV 81.4 80.0 - 100.0 fL   MCH 26.4 26.0 - 34.0 pg   MCHC 32.4 30.0 - 36.0 g/dL   RDW 01.6 01.0 - 93.2 %   Platelets 293 150 - 400 K/uL   nRBC 0.0 0.0 - 0.2 %  hCG, quantitative, pregnancy     Status: None   Collection Time: 01/22/21 11:19 AM  Result Value Ref Range   hCG, Beta Chain, Quant, S 1 <5 mIU/mL    US PELVIS (TRANSABDOMINAL ONLY)  Result Date: 01/22/2021 CLINICAL DATA:  Postpartum bleeding following delivery on 12/17/2020 EXAM: TRANSABDOMINAL ULTRASOUND OF PELVIS TECHNIQUE: Transabdominal ultrasound examination of the pelvis was performed including evaluation of the uterus, ovaries, adnexal regions, and pelvic cul-de-sac. COMPARISON:  None. FINDINGS: Uterus Measurements: 7.6 x 4.6 x 5.9 cm = volume: 106 mL. No fibroids or other mass visualized. Endometrium Thickness: 17 mm. Nodular area of heterogeneously hyperechoic tissue within the endometrium measuring approximately 2.2 x 1.6 x 2.0 cm with internal vascularity. No fluid within the endometrial canal. Right ovary Measurements: 2.5 x 0.9 x 2.1 cm = volume: 2 mL. Normal appearance/no adnexal mass. Left ovary Measurements: 2.4 x 1.8 x 1.7 cm = volume: 2 mL. Normal appearance/no adnexal mass.  Other findings:  No abnormal free fluid. IMPRESSION: 1. Focal area of soft tissue in the endometrial canal with internal vascularity measuring up to 2.2 cm. Given the patient's history, this is most suggestive of retained products of conception. 2. Unremarkable appearance of the bilateral ovaries. Electronically Signed   By: Duanne Guess D.O.   On: 01/22/2021 13:22  Assessment and Plan  Retained products of conception, postpartum  - Prep for OR - Procedure to take place after 1800 today - Dr. Vonna Kotyk to assume care at 1530 -- see her H&P for further documentation  Language barrier affecting health care  [employer] Video: 1) [facility], [phone] used for assessment, exam and explanation of treatment plan 2) [employer], #[id number] used for explanation of results, options for treatment (medical or surgical management) and to discuss r/b or all options.  [name], CNM 01/22/2021, 11:19 AM

## 2021-01-22 NOTE — Op Note (Signed)
01/22/21  Preoperative Diagnosis: postpartum vaginal bleeding with concern for retained POC Postoperative Diagnosis: Same as above Procedure: Suction dilation and curettage Surgeon: Ellison Hughs, MD Anesthesia: LMA by Dr Chilton Si IVF: 300cc UOP: 100cc clear yellow urine prior to procedure EBL: <50cc   Findings: On bimanual exam, approx 9wk size uterus, mobile, anteverted: BL adenxa WNL. Uterus sounded to 9.5cm. Upon insertion of operative speculum, 1cm dilated external    Specimen: Curettings to pathology Complications: none   Patient is a 34yo [id number] presenting 5wk pp with episodes of heavy vaginal bleeding, evaluation via TVUS shows 2cm EMS with vascularity concerning for RPOC. Patient elected for surgical management. The patient was informed of the risks and benefits of a dilation and curettage. Risks included but were not limited to bleeidng, infections, injury to the vulva, vagina or cerivx, or uterine perforation. If concern for latter, may proceed with diagnostic laparoscopy for evaluation of injury which may require surgical repair. Patient understands and is amenable.    Operative procedure: Pt was taken to operating room where LMA anesthesia was obtained without difficulty.  She was prepped and draped in the normal sterile fashion in the dorsal lithotomy position.  An appropriate timeout was performed.  A speculum was placed in the vagina and 2cc of 1% plain lidocaine injected into the anterior cervix.  An additional 9cc was placed both at 2 and 10 o'clock for a paracervical block. A single tooth tenaculum was placed on the anterior lip of the cervix. The 38mm suction currette was obtained and easily introduced into the fundus.  With 2 passes, a significant amount of tissue was obtained.  When no further tissue was noted, the suction was discontinued and a gentle currettage performed.  1 additional pass revealed no further tissue. The tenaculum was removed and pressure used for hemostasis.  All instruments and sponges were removed from vagina and the patient awakened and taken to the PACU in good condition.

## 2021-01-22 NOTE — Anesthesia Procedure Notes (Signed)
Procedure Name: Intubation Date/Time: 01/22/2021 6:28 PM Performed by: Reece Agar, CRNA Pre-anesthesia Checklist: Patient identified, Emergency Drugs available, Suction available and Patient being monitored Patient Re-evaluated:Patient Re-evaluated prior to induction Oxygen Delivery Method: Circle System Utilized Preoxygenation: Pre-oxygenation with 100% oxygen Induction Type: IV induction, Rapid sequence and Cricoid Pressure applied Ventilation: Mask ventilation without difficulty Laryngoscope Size: Mac and 3 Grade View: Grade I Tube type: Oral Tube size: 7.0 mm Number of attempts: 1 Airway Equipment and Method: Stylet Placement Confirmation: ETT inserted through vocal cords under direct vision, positive ETCO2 and breath sounds checked- equal and bilateral Secured at: 21 cm Tube secured with: Tape Dental Injury: Teeth and Oropharynx as per pre-operative assessment

## 2021-01-22 NOTE — Anesthesia Postprocedure Evaluation (Signed)
Anesthesia Post Note  Patient: Margaret Schmidt  Procedure(s) Performed: DILATATION AND EVACUATION     Patient location during evaluation: PACU Anesthesia Type: General Level of consciousness: awake Pain management: pain level controlled Vital Signs Assessment: post-procedure vital signs reviewed and stable Respiratory status: spontaneous breathing Cardiovascular status: stable Postop Assessment: no apparent nausea or vomiting Anesthetic complications: no   No notable events documented.  Last Vitals:  Vitals:   01/22/21 1930 01/22/21 1945  BP: (!) 132/91 139/90  Pulse: 70 70  Resp: 16 17  Temp:  36.8 C  SpO2: 96% 98%    Last Pain:  Vitals:   01/22/21 1945  TempSrc:   PainSc: 0-No pain                 Khizar Fiorella

## 2021-01-22 NOTE — Anesthesia Preprocedure Evaluation (Signed)
Anesthesia Evaluation  Patient identified by MRN, date of birth, ID bandGeneral Assessment Comment:History noted. Dr. Chilton Si  Reviewed: Allergy & Precautions, NPO status , Patient's Chart, lab work & pertinent test results  Airway Mallampati: II  TM Distance: >3 FB     Dental   Pulmonary neg pulmonary ROS,    breath sounds clear to auscultation       Cardiovascular hypertension,  Rhythm:Regular Rate:Normal     Neuro/Psych    GI/Hepatic Neg liver ROS,   Endo/Other  negative endocrine ROS  Renal/GU negative Renal ROS     Musculoskeletal   Abdominal   Peds  Hematology   Anesthesia Other Findings   Reproductive/Obstetrics                             Anesthesia Physical Anesthesia Plan  ASA: 2 and emergent  Anesthesia Plan: General   Post-op Pain Management:    Induction: Intravenous  PONV Risk Score and Plan: Ondansetron, Dexamethasone and Midazolam  Airway Management Planned: Oral ETT  Additional Equipment:   Intra-op Plan:   Post-operative Plan: Extubation in OR  Informed Consent: I have reviewed the patients History and Physical, chart, labs and discussed the procedure including the risks, benefits and alternatives for the proposed anesthesia with the patient or authorized representative who has indicated his/her understanding and acceptance.     Dental advisory given  Plan Discussed with: CRNA and Anesthesiologist  Anesthesia Plan Comments:         Anesthesia Quick Evaluation

## 2021-01-22 NOTE — Transfer of Care (Signed)
Immediate Anesthesia Transfer of Care Note  Patient: Makell Roye  Procedure(s) Performed: DILATATION AND EVACUATION  Patient Location: PACU  Anesthesia Type:General  Level of Consciousness: drowsy  Airway & Oxygen Therapy: Patient Spontanous Breathing  Post-op Assessment: Report given to RN and Post -op Vital signs reviewed and stable  Post vital signs: Reviewed and stable  Last Vitals:  Vitals Value Taken Time  BP    Temp    Pulse 100 01/22/21 1858  Resp 18 01/22/21 1858  SpO2 95 % 01/22/21 1858  Vitals shown include unvalidated device data.  Last Pain:  Vitals:   01/22/21 1806  TempSrc: Oral  PainSc:          Complications: No notable events documented.

## 2021-01-22 NOTE — H&P (Signed)
Margaret Schmidt is a 35 y.o. female presenting for heavy VB postpartum with + clots yesterday Occ fatigue, notes that she changes a heavy pad q72m for several hours and then will slow. During heavy flow, +cramping. G1 was significant for retained placenta after cord avulsion requiring D&C. This pregnancy was IOL for GHTN, asymptomatic, DC home PPD#1  OB History     Gravida  2   Para  1   Term  1   Preterm      AB      Living  1      SAB      IAB      Ectopic      Multiple  0   Live Births  1          Past Medical History:  Diagnosis Date   Infertility, female    Pregnancy induced hypertension    Past Surgical History:  Procedure Laterality Date   DILATION AND CURETTAGE OF UTERUS N/A 10/16/2019   Procedure: DILATATION AND CURETTAGE;  Surgeon: Waynard Reeds, MD;  Location: [facility] LD ORS;  Service: Gynecology;  Laterality: N/A;   Family History: family history is not on file. Social History:  reports that she has never smoked. She has never used smokeless tobacco. She reports that she does not drink alcohol and does not use drugs.  Review of Systems  Constitutional:  Positive for fatigue. Negative for chills and fever.  Respiratory:  Negative for chest tightness and shortness of breath.   Cardiovascular:  Negative for chest pain, palpitations and leg swelling.  Gastrointestinal:  Negative for abdominal pain and vomiting.  Genitourinary:  Positive for vaginal bleeding.  Neurological:  Negative for dizziness, weakness and headaches.  Psychiatric/Behavioral:  Negative for suicidal ideas.   Maternal Medical History:  Reason for admission: Vaginal bleeding.      Blood pressure 137/83, pulse 79, temperature 98.2 F (36.8 C), temperature source Oral, resp. rate 18, SpO2 100 %, currently breastfeeding. Physical Exam: Gen: NAD CV: CTAB, RRR Abd: Soft, NTTP, Bsx4, Fundus firm below umbilicus GU: Dark old blood at perineum, no active bleeding MSK: Neg calf edema/TTP  BL  TVUS: CLINICAL DATA:  Postpartum bleeding following delivery on 12/17/2020   EXAM: TRANSABDOMINAL ULTRASOUND OF PELVIS   TECHNIQUE: Transabdominal ultrasound examination of the pelvis was performed including evaluation of the uterus, ovaries, adnexal regions, and pelvic cul-de-sac.   COMPARISON:  None.   FINDINGS: Uterus   Measurements: 7.6 x 4.6 x 5.9 cm = volume: 106 mL. No fibroids or other mass visualized.   Endometrium   Thickness: 17 mm. Nodular area of heterogeneously hyperechoic tissue within the endometrium measuring approximately 2.2 x 1.6 x 2.0 cm with internal vascularity. No fluid within the endometrial canal.   Right ovary   Measurements: 2.5 x 0.9 x 2.1 cm = volume: 2 mL. Normal appearance/no adnexal mass.   Left ovary   Measurements: 2.4 x 1.8 x 1.7 cm = volume: 2 mL. Normal appearance/no adnexal mass.   Other findings:  No abnormal free fluid.   IMPRESSION: 1. Focal area of soft tissue in the endometrial canal with internal vascularity measuring up to 2.2 cm. Given the patient's history, this is most suggestive of retained products of conception. 2. Unremarkable appearance of the bilateral ovaries.  Prenatal labs: ABO, Rh: --/--/O NEG (09/08 1016) Antibody: NEG (09/08 1016) Rubella: Immune (02/24 0000) RPR: NON REACTIVE (09/08 1016)  HBsAg: Negative (02/24 0000)  HIV: Non-reactive (02/24 0000)  GBS: Negative/-- (08/19 0000)  CBC: 11.8/36.4/293  Assessment/Plan: This is a 34yo [id number] s/p SVD PPD#36 presenting with heavy VB and TVUS findings consistent with RPOC. Minimal bleeding at this time, has been NPO since 1000. Offered both medical and surgical options given non-urgent picture. Given her history, patient opts for surgical management in the form of D&C. Anesthesia states 8hr after PO intake, meaning approx 1800 today.  The patient was informed of the risks and benefits of a dilation and curettage. Risks included but were not limited  to bleeidng, infections, injury to the vulva, vagina or cerivx, or uterine perforation. If concern for latter, may proceed with diagnostic laparoscopy for evaluation of injury which may require surgical repair. Patient understands and is amenable.     Valerie Roys Darby Fleeman 01/22/2021, 3:07 PM

## 2021-01-23 ENCOUNTER — Encounter (HOSPITAL_COMMUNITY): Payer: Self-pay | Admitting: Obstetrics and Gynecology

## 2021-01-24 ENCOUNTER — Ambulatory Visit: Admit: 2021-01-24 | Payer: Medicaid Other | Admitting: Obstetrics and Gynecology

## 2021-01-25 LAB — SURGICAL PATHOLOGY

## 2022-10-17 LAB — OB RESULTS CONSOLE HIV ANTIBODY (ROUTINE TESTING): HIV: NONREACTIVE

## 2022-10-17 LAB — OB RESULTS CONSOLE RPR: RPR: NONREACTIVE

## 2022-10-17 LAB — HEPATITIS C ANTIBODY: HCV Ab: NEGATIVE

## 2022-10-17 LAB — OB RESULTS CONSOLE HEPATITIS B SURFACE ANTIGEN: Hepatitis B Surface Ag: NEGATIVE

## 2022-10-17 LAB — OB RESULTS CONSOLE RUBELLA ANTIBODY, IGM: Rubella: IMMUNE

## 2023-04-09 LAB — OB RESULTS CONSOLE GBS: GBS: NEGATIVE

## 2023-04-11 NOTE — L&D Delivery Note (Signed)
 Delivery Note Pt pushed well for 10-4mins to +3 station. At 10:55 AM a viable female was delivered via Vaginal, Spontaneous (Presentation: Left Occiput Anterior).  APGAR: 8, 9; weight pending  Anterior and posterior shoulders delivered easily next Cord clamped and cut after a minute delay with baby on mothers' abdomen. Cord blood obtained    Placenta status: Manual at 25 mins after svd, Intact but ratty. Uterine sweep x 3 done with no retained products noted   Cord: 3 vessels with the following complications: None.  Cord pH: n/a  Given pt history of delayed complication of retained POC, rectal cytotec  placed. Will give an extra dose of IV antibiotics as well given manual removal of placenta  Anesthesia: Epidural Episiotomy: None Lacerations: perineal abrasion    Suture Repair:  n/a Est. Blood Loss (mL):   Mom to postpartum.  Baby to Couplet care / Skin to Skin.  Ted LELON Solo 04/20/2023, 11:23 AM

## 2023-04-19 ENCOUNTER — Inpatient Hospital Stay (HOSPITAL_COMMUNITY)
Admission: AD | Admit: 2023-04-19 | Discharge: 2023-04-21 | DRG: 798 | Disposition: A | Discharge status: Home / Self Care | Payer: Medicaid Other | Attending: Obstetrics and Gynecology | Admitting: Obstetrics and Gynecology

## 2023-04-19 ENCOUNTER — Inpatient Hospital Stay (HOSPITAL_COMMUNITY): Payer: Medicaid Other | Admitting: Anesthesiology

## 2023-04-19 ENCOUNTER — Other Ambulatory Visit: Payer: Self-pay

## 2023-04-19 ENCOUNTER — Encounter (HOSPITAL_COMMUNITY): Payer: Self-pay

## 2023-04-19 DIAGNOSIS — K219 Gastro-esophageal reflux disease without esophagitis: Secondary | ICD-10-CM | POA: Diagnosis present

## 2023-04-19 DIAGNOSIS — O139 Gestational [pregnancy-induced] hypertension without significant proteinuria, unspecified trimester: Principal | ICD-10-CM | POA: Diagnosis present

## 2023-04-19 DIAGNOSIS — O26893 Other specified pregnancy related conditions, third trimester: Secondary | ICD-10-CM | POA: Diagnosis present

## 2023-04-19 DIAGNOSIS — O9902 Anemia complicating childbirth: Secondary | ICD-10-CM | POA: Diagnosis present

## 2023-04-19 DIAGNOSIS — Z3A37 37 weeks gestation of pregnancy: Secondary | ICD-10-CM | POA: Diagnosis not present

## 2023-04-19 DIAGNOSIS — O9962 Diseases of the digestive system complicating childbirth: Secondary | ICD-10-CM | POA: Diagnosis present

## 2023-04-19 DIAGNOSIS — O99214 Obesity complicating childbirth: Secondary | ICD-10-CM | POA: Diagnosis present

## 2023-04-19 DIAGNOSIS — Z6791 Unspecified blood type, Rh negative: Secondary | ICD-10-CM | POA: Diagnosis not present

## 2023-04-19 DIAGNOSIS — O134 Gestational [pregnancy-induced] hypertension without significant proteinuria, complicating childbirth: Secondary | ICD-10-CM | POA: Diagnosis present

## 2023-04-19 LAB — CBC
HCT: 35.5 % — ABNORMAL LOW (ref 36.0–46.0)
Hemoglobin: 11.6 g/dL — ABNORMAL LOW (ref 12.0–15.0)
MCH: 26.2 pg (ref 26.0–34.0)
MCHC: 32.7 g/dL (ref 30.0–36.0)
MCV: 80.3 fL (ref 80.0–100.0)
Platelets: 250 10*3/uL (ref 150–400)
RBC: 4.42 MIL/uL (ref 3.87–5.11)
RDW: 13.7 % (ref 11.5–15.5)
WBC: 9.7 10*3/uL (ref 4.0–10.5)
nRBC: 0 % (ref 0.0–0.2)

## 2023-04-19 LAB — COMPREHENSIVE METABOLIC PANEL
ALT: 18 U/L (ref 0–44)
AST: 20 U/L (ref 15–41)
Albumin: 2.7 g/dL — ABNORMAL LOW (ref 3.5–5.0)
Alkaline Phosphatase: 109 U/L (ref 38–126)
Anion gap: 10 (ref 5–15)
BUN: 11 mg/dL (ref 6–20)
CO2: 19 mmol/L — ABNORMAL LOW (ref 22–32)
Calcium: 9.4 mg/dL (ref 8.9–10.3)
Chloride: 108 mmol/L (ref 98–111)
Creatinine, Ser: 0.64 mg/dL (ref 0.44–1.00)
GFR, Estimated: >60 mL/min (ref >60)
Glucose, Bld: 106 mg/dL — ABNORMAL HIGH (ref 70–99)
Potassium: 3.5 mmol/L (ref 3.5–5.1)
Sodium: 137 mmol/L (ref 135–145)
Total Bilirubin: 0.3 mg/dL (ref 0.0–1.2)
Total Protein: 6.6 g/dL (ref 6.5–8.1)

## 2023-04-19 LAB — TYPE AND SCREEN
ABO/RH(D): O NEG
Antibody Screen: POSITIVE

## 2023-04-19 MED ORDER — PHENYLEPHRINE 80 MCG/ML (10ML) SYRINGE FOR IV PUSH (FOR BLOOD PRESSURE SUPPORT)
80.0000 ug | PREFILLED_SYRINGE | INTRAVENOUS | Status: DC | PRN
Start: 2023-04-19 — End: 2023-04-20

## 2023-04-19 MED ORDER — LACTATED RINGERS IV SOLN
500.0000 mL | Freq: Once | INTRAVENOUS | Status: AC
  Administered 2023-04-19: 500 mL via INTRAVENOUS

## 2023-04-19 MED ORDER — OXYTOCIN-SODIUM CHLORIDE 30-0.9 UT/500ML-% IV SOLN
2.5000 [IU]/h | INTRAVENOUS | Status: DC
Start: 2023-04-19 — End: 2023-04-20

## 2023-04-19 MED ORDER — LACTATED RINGERS IV SOLN
INTRAVENOUS | Status: DC
Start: 2023-04-19 — End: 2023-04-20

## 2023-04-19 MED ORDER — OXYCODONE-ACETAMINOPHEN 5-325 MG PO TABS: 2.0000 | ORAL_TABLET | ORAL | Status: DC | PRN

## 2023-04-19 MED ORDER — FENTANYL-BUPIVACAINE-NACL 0.5-0.125-0.9 MG/250ML-% EP SOLN
12.0000 mL/h | EPIDURAL | Status: DC | PRN
  Administered 2023-04-19: 12 mL/h via EPIDURAL
  Filled 2023-04-19: qty 250

## 2023-04-19 MED ORDER — TERBUTALINE SULFATE 1 MG/ML IJ SOLN: 0.2500 mg | Freq: Once | INTRAMUSCULAR | Status: DC | PRN

## 2023-04-19 MED ORDER — LACTATED RINGERS IV SOLN: 500.0000 mL | INTRAVENOUS | Status: DC | PRN

## 2023-04-19 MED ORDER — OXYTOCIN BOLUS FROM INFUSION
333.0000 mL | Freq: Once | INTRAVENOUS | Status: AC
  Administered 2023-04-20: 333 mL via INTRAVENOUS

## 2023-04-19 MED ORDER — FENTANYL CITRATE (PF) 100 MCG/2ML IJ SOLN: 50.0000 ug | INTRAMUSCULAR | Status: DC | PRN

## 2023-04-19 MED ORDER — OXYTOCIN-SODIUM CHLORIDE 30-0.9 UT/500ML-% IV SOLN
1.0000 m[IU]/min | INTRAVENOUS | Status: DC
  Administered 2023-04-19: 2 m[IU]/min via INTRAVENOUS
  Filled 2023-04-19: qty 500

## 2023-04-19 MED ORDER — EPHEDRINE 5 MG/ML INJ: 10.0000 mg | INTRAVENOUS | Status: DC | PRN

## 2023-04-19 MED ORDER — SOD CITRATE-CITRIC ACID 500-334 MG/5ML PO SOLN: 30.0000 mL | ORAL | Status: DC | PRN

## 2023-04-19 MED ORDER — LIDOCAINE HCL (PF) 1 % IJ SOLN
INTRAMUSCULAR | Status: DC | PRN
  Administered 2023-04-19 (×2): 5 mL via EPIDURAL

## 2023-04-19 MED ORDER — DIPHENHYDRAMINE HCL 50 MG/ML IJ SOLN: 12.5000 mg | INTRAMUSCULAR | Status: DC | PRN

## 2023-04-19 MED ORDER — LIDOCAINE HCL (PF) 1 % IJ SOLN: 30.0000 mL | INTRAMUSCULAR | Status: DC | PRN

## 2023-04-19 MED ORDER — ONDANSETRON HCL 4 MG/2ML IJ SOLN
4.0000 mg | Freq: Four times a day (QID) | INTRAMUSCULAR | Status: DC | PRN
  Administered 2023-04-20: 4 mg via INTRAVENOUS
  Filled 2023-04-19: qty 2

## 2023-04-19 MED ORDER — OXYCODONE-ACETAMINOPHEN 5-325 MG PO TABS: 1.0000 | ORAL_TABLET | ORAL | Status: DC | PRN

## 2023-04-19 MED ORDER — ACETAMINOPHEN 325 MG PO TABS: 650.0000 mg | ORAL_TABLET | ORAL | Status: DC | PRN

## 2023-04-19 NOTE — MAU Note (Signed)
.  Margaret Schmidt is a 38 y.o. at [redacted]w[redacted]d here in MAU reporting being here for induction due to elevated b/p in office today. 160/100 in office. Was 144/88 on Tues. At reck today b/p was as above and was told to come for IOL. Pt denies LOF or VB. Reports good FM  Onset of complaint: today Pain score: 0 Vitals:   04/19/23 1938 04/19/23 1943  BP:  134/85  Pulse: 78   Resp: 17   Temp: 98.3 F (36.8 C)   SpO2: 100%      FHT:155 Lab orders placed from triage: none.

## 2023-04-19 NOTE — Anesthesia Procedure Notes (Signed)
 Epidural Patient location during procedure: OB Start time: 04/19/2023 11:42 PM End time: 04/19/2023 11:51 PM  Staffing Anesthesiologist: Jerrye Sharper, MD Performed: anesthesiologist   Preanesthetic Checklist Completed: patient identified, IV checked, site marked, risks and benefits discussed, surgical consent, monitors and equipment checked, pre-op evaluation and timeout performed  Epidural Patient position: sitting Prep: DuraPrep and site prepped and draped Patient monitoring: continuous pulse ox and blood pressure Approach: midline Location: L3-L4 Injection technique: LOR air  Needle:  Needle type: Tuohy  Needle gauge: 17 G Needle length: 9 cm and 9 Needle insertion depth: 5 cm Catheter type: closed end flexible Catheter size: 19 Gauge Catheter at skin depth: 10 cm Test dose: negative and Other  Assessment Events: blood not aspirated, no cerebrospinal fluid, injection not painful, no injection resistance, no paresthesia and negative IV test  Additional Notes Patient identified. Risks and benefits discussed including failed block, incomplete  Pain control, post dural puncture headache, nerve damage, paralysis, blood pressure Changes, nausea, vomiting, reactions to medications-both toxic and allergic and post Partum back pain. All questions were answered. Patient expressed understanding and wished to proceed. Sterile technique was used throughout procedure. Epidural site was Dressed with sterile barrier dressing. No paresthesias, signs of intravascular injection Or signs of intrathecal spread were encountered. Arabic interpreter used throughout the procedure. Patient was more comfortable after the epidural was dosed. Please see RN's note for documentation of vital signs and FHR which are stable. Reason for block:procedure for pain

## 2023-04-19 NOTE — Anesthesia Preprocedure Evaluation (Signed)
 Anesthesia Evaluation    Airway Mallampati: II       Dental no notable dental hx. (+) Teeth Intact   Pulmonary neg pulmonary ROS   Pulmonary exam normal breath sounds clear to auscultation       Cardiovascular hypertension, Normal cardiovascular exam Rhythm:Regular     Neuro/Psych negative neurological ROS  negative psych ROS   GI/Hepatic Neg liver ROS,GERD  ,,  Endo/Other  Obesity  Renal/GU negative Renal ROS  negative genitourinary   Musculoskeletal negative musculoskeletal ROS (+)    Abdominal  (+) + obese  Peds  Hematology  (+) Blood dyscrasia, anemia   Anesthesia Other Findings   Reproductive/Obstetrics (+) Pregnancy                              Anesthesia Physical Anesthesia Plan  ASA: 2  Anesthesia Plan: Epidural   Post-op Pain Management: Minimal or no pain anticipated   Induction:   PONV Risk Score and Plan:   Airway Management Planned: Natural Airway  Additional Equipment: None and Fetal Monitoring  Intra-op Plan:   Post-operative Plan:   Informed Consent:   Plan Discussed with:   Anesthesia Plan Comments:          Anesthesia Quick Evaluation

## 2023-04-19 NOTE — H&P (Signed)
 Margaret Schmidt is a 38 y.o. female presenting for direct admission for new diagnosis of gestational hypertension.  Arabic interpreter at bedside.   Was seen in office yesterday with first elevated blood pressure 140/84, 144/80. Preeclampsia labs sent and CBC/CMP normal. Returned today for BP check in office, BP 160/100. She was sent for direct admission.   Denies headache, vision changes, RUQ pain. Normal FM. No LOF or VB.   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 Has been on ASA this pregnancy. Baseline labs normal.  History of retained products: G1 Retained placenta (cord avulsed from placenta) - removed in OR. G2 retained products, underwent D&C 1 month after delivery.  Low lying placenta: resolved Prediabetes: passed early and 28 week GTT RH neg: received rhogam 10/31 Obesity: initial BMI 36  OB History     Gravida  3   Para  2   Term  2   Preterm      AB      Living  2      SAB      IAB      Ectopic      Multiple  0   Live Births  2          Past Medical History:  Diagnosis Date   Infertility, female    Pregnancy induced hypertension    Past Surgical History:  Procedure Laterality Date   DILATION AND CURETTAGE OF UTERUS N/A 10/16/2019   Procedure: DILATATION AND CURETTAGE;  Surgeon: Okey Leader, MD;  Location: [facility] ORS;  Service: Gynecology;  Laterality: N/A;   DILATION AND EVACUATION N/A 01/22/2021   Procedure: DILATATION AND EVACUATION;  Surgeon: Sudie Lavonia HERO, MD;  Location: [facility] OR;  Service: Gynecology;  Laterality: N/A;   Family History: family history is not on file. Social History:  reports that she has never smoked. She has never used smokeless tobacco. She reports that she does not drink alcohol and does not use drugs.     Maternal Diabetes: No Genetic Screening: Normal Maternal  Ultrasounds/Referrals: Normal Fetal Ultrasounds or other Referrals:  None Maternal Substance Abuse:  No Significant Maternal Medications:  None Significant Maternal Lab Results:  Group B Strep negative and Rh negative Number of Prenatal Visits:greater than 3 verified prenatal visits Maternal Vaccinations:TDap and Flu Other Comments:  None  Review of Systems as noted in HPI History Dilation: 3 Effacement (%): 50 Station: -3 Exam by:: Dr Diedre Blood pressure (!) 151/83, pulse 86, temperature 98.8 F (37.1 C), temperature source Axillary, resp. rate 18, height 5' 7 (1.702 m), weight 105.7 kg, SpO2 100%, currently breastfeeding. Exam Physical Exam   Gen: well appearing CVS: normal pulses Lungs: nonlabored respirations Abd: gravid, Le[place] 7lbs Ext: no calf edema or tenderness  NST: 155bpm, mod variability, + accels, no decels Toco: quiet  Prenatal labs: ABO, Rh:   Antibody:   Rubella: Immune (07/09 0000) RPR: Nonreactive (07/09 0000)  HBsAg: Negative (07/09 0000)  HIV: Non-reactive (07/09 0000)  GBS: Negative/-- (12/30 0000)   Assessment/Plan: 37Y G3P2002 @ [redacted]w[redacted]d, IOL GHTN Fetal wellbeing: cat I tracing IOL: start pitocin , plan AROM when station lower GHTN: admission labs pending, normal to mild range blood pressures since arrival to L&D. Monitor for signs/symptoms of severe preeclampsia.  Pain control: planning epidural   Rosaline FORBES Diedre 04/19/2023, 9:08 PM

## 2023-04-19 NOTE — Plan of Care (Signed)
  Problem: Education: Goal: Knowledge of General Education information will improve Description: Including pain rating scale, medication(s)/side effects and non-pharmacologic comfort measures Outcome: Progressing   Problem: Health Behavior/Discharge Planning: Goal: Ability to manage health-related needs will improve Outcome: Progressing   Problem: Clinical Measurements: Goal: Ability to maintain clinical measurements within normal limits will improve Outcome: Progressing Goal: Will remain free from infection Outcome: Progressing Goal: Diagnostic test results will improve Outcome: Progressing Goal: Respiratory complications will improve Outcome: Progressing Goal: Cardiovascular complication will be avoided Outcome: Progressing   Problem: Activity: Goal: Risk for activity intolerance will decrease Outcome: Progressing   Problem: Nutrition: Goal: Adequate nutrition will be maintained Outcome: Progressing   Problem: Coping: Goal: Level of anxiety will decrease Outcome: Progressing   Problem: Elimination: Goal: Will not experience complications related to bowel motility Outcome: Progressing Goal: Will not experience complications related to urinary retention Outcome: Progressing   Problem: Pain Management: Goal: General experience of comfort will improve Outcome: Progressing   Problem: Safety: Goal: Ability to remain free from injury will improve Outcome: Progressing   Problem: Skin Integrity: Goal: Risk for impaired skin integrity will decrease Outcome: Progressing   Problem: Education: Goal: Knowledge of Childbirth will improve Outcome: Progressing Goal: Ability to make informed decisions regarding treatment and plan of care will improve Outcome: Progressing Goal: Ability to state and carry out methods to decrease the pain will improve Outcome: Progressing Goal: Individualized Educational Video(s) Outcome: Progressing   Problem: Coping: Goal: Ability to  verbalize concerns and feelings about labor and delivery will improve Outcome: Progressing   Problem: Life Cycle: Goal: Ability to make normal progression through stages of labor will improve Outcome: Progressing Goal: Ability to effectively push during vaginal delivery will improve Outcome: Progressing   Problem: Role Relationship: Goal: Will demonstrate positive interactions with the child Outcome: Progressing   Problem: Safety: Goal: Risk of complications during labor and delivery will decrease Outcome: Progressing   Problem: Pain Management: Goal: Relief or control of pain from uterine contractions will improve Outcome: Progressing

## 2023-04-19 NOTE — MAU Note (Signed)
 Alechia RN CN on BS called. They are expecting pt to [facility] for IOL. Pt will go to [facility].

## 2023-04-20 ENCOUNTER — Encounter (HOSPITAL_COMMUNITY): Payer: Self-pay | Admitting: Obstetrics and Gynecology

## 2023-04-20 LAB — PROTEIN / CREATININE RATIO, URINE
Creatinine, Urine: 167 mg/dL
Protein Creatinine Ratio: 0.06 mg/mg{creat} (ref 0.00–0.15)
Total Protein, Urine: 10 mg/dL

## 2023-04-20 LAB — RPR: RPR Ser Ql: NONREACTIVE

## 2023-04-20 MED ORDER — OXYCODONE HCL 5 MG PO TABS: 10.0000 mg | ORAL_TABLET | ORAL | Status: DC | PRN

## 2023-04-20 MED ORDER — ZOLPIDEM TARTRATE 5 MG PO TABS: 5.0000 mg | ORAL_TABLET | Freq: Every evening | ORAL | Status: DC | PRN

## 2023-04-20 MED ORDER — CEFAZOLIN SODIUM-DEXTROSE 1-4 GM/50ML-% IV SOLN
1.0000 g | Freq: Four times a day (QID) | INTRAVENOUS | Status: AC
  Administered 2023-04-20 – 2023-04-21 (×3): 1 g via INTRAVENOUS
  Filled 2023-04-20 (×4): qty 50

## 2023-04-20 MED ORDER — SENNOSIDES-DOCUSATE SODIUM 8.6-50 MG PO TABS
2.0000 | ORAL_TABLET | Freq: Every day | ORAL | Status: DC
  Administered 2023-04-21: 2 via ORAL
  Filled 2023-04-20: qty 2

## 2023-04-20 MED ORDER — ONDANSETRON HCL 4 MG/2ML IJ SOLN: 4.0000 mg | INTRAMUSCULAR | Status: DC | PRN

## 2023-04-20 MED ORDER — TETANUS-DIPHTH-ACELL PERTUSSIS 5-2.5-18.5 LF-MCG/0.5 IM SUSY
0.5000 mL | PREFILLED_SYRINGE | Freq: Once | INTRAMUSCULAR | Status: DC
  Filled 2023-04-20: qty 0.5

## 2023-04-20 MED ORDER — SIMETHICONE 80 MG PO CHEW: 80.0000 mg | CHEWABLE_TABLET | ORAL | Status: DC | PRN

## 2023-04-20 MED ORDER — MISOPROSTOL 200 MCG PO TABS
ORAL_TABLET | ORAL | Status: AC
  Filled 2023-04-20: qty 4

## 2023-04-20 MED ORDER — IBUPROFEN 600 MG PO TABS
600.0000 mg | ORAL_TABLET | Freq: Four times a day (QID) | ORAL | Status: DC
  Administered 2023-04-20 – 2023-04-21 (×4): 600 mg via ORAL
  Filled 2023-04-20 (×4): qty 1

## 2023-04-20 MED ORDER — ONDANSETRON HCL 4 MG PO TABS: 4.0000 mg | ORAL_TABLET | ORAL | Status: DC | PRN

## 2023-04-20 MED ORDER — DIBUCAINE (PERIANAL) 1 % EX OINT: 1.0000 | TOPICAL_OINTMENT | CUTANEOUS | Status: DC | PRN

## 2023-04-20 MED ORDER — ACETAMINOPHEN 325 MG PO TABS
650.0000 mg | ORAL_TABLET | ORAL | Status: DC | PRN
  Administered 2023-04-20 – 2023-04-21 (×2): 650 mg via ORAL
  Filled 2023-04-20 (×2): qty 2

## 2023-04-20 MED ORDER — PRENATAL MULTIVITAMIN CH
1.0000 | ORAL_TABLET | Freq: Every day | ORAL | Status: DC
  Administered 2023-04-20 – 2023-04-21 (×2): 1 via ORAL
  Filled 2023-04-20 (×2): qty 1

## 2023-04-20 MED ORDER — LACTATED RINGERS IV SOLN
INTRAVENOUS | Status: AC
Start: 2023-04-20 — End: 2023-04-21

## 2023-04-20 MED ORDER — MISOPROSTOL 200 MCG PO TABS
800.0000 ug | ORAL_TABLET | Freq: Once | ORAL | Status: AC
  Administered 2023-04-20: 800 ug via RECTAL

## 2023-04-20 MED ORDER — DIPHENHYDRAMINE HCL 25 MG PO CAPS: 25.0000 mg | ORAL_CAPSULE | Freq: Four times a day (QID) | ORAL | Status: DC | PRN

## 2023-04-20 MED ORDER — BENZOCAINE-MENTHOL 20-0.5 % EX AERO: 1.0000 | INHALATION_SPRAY | CUTANEOUS | Status: DC | PRN

## 2023-04-20 MED ORDER — OXYCODONE HCL 5 MG PO TABS: 5.0000 mg | ORAL_TABLET | ORAL | Status: DC | PRN

## 2023-04-20 MED ORDER — COCONUT OIL OIL: 1.0000 | TOPICAL_OIL | Status: DC | PRN

## 2023-04-20 MED ORDER — WITCH HAZEL-GLYCERIN EX PADS: 1.0000 | MEDICATED_PAD | CUTANEOUS | Status: DC | PRN

## 2023-04-20 NOTE — Anesthesia Postprocedure Evaluation (Signed)
 Anesthesia Post Note  Patient: Margaret Schmidt  Procedure(s) Performed: AN AD HOC LABOR EPIDURAL     Patient location during evaluation: Mother Baby Anesthesia Type: Epidural Level of consciousness: awake, awake and alert and oriented Pain management: satisfactory to patient Vital Signs Assessment: post-procedure vital signs reviewed and stable Respiratory status: spontaneous breathing, nonlabored ventilation and respiratory function stable Cardiovascular status: blood pressure returned to baseline and stable Postop Assessment: no headache, no backache, no apparent nausea or vomiting, able to ambulate, adequate PO intake and patient able to bend at knees Anesthetic complications: no   No notable events documented.  Last Vitals:  Vitals:   04/20/23 1425 04/20/23 1802  BP: 120/80 120/75  Pulse: 72 68  Resp: 17 18  Temp: 37.2 C 37.1 C  SpO2:      Last Pain:  Vitals:   04/20/23 1803  TempSrc:   PainSc: 0-No pain   Pain Goal:                   Bergen Magner

## 2023-04-20 NOTE — Progress Notes (Signed)
 OB Progress Note  S: Print production planner present Patient comfortable with epidural   O: Today's Vitals   04/20/23 0000 04/20/23 0005 04/20/23 0010 04/20/23 0015  BP: 124/87 129/84 (!) 134/93 139/86  Pulse: 83 90 96 75  Resp:      Temp:      TempSrc:      SpO2: 100% 100% 100% 100%  Weight:      Height:      PainSc:       Body mass index is 36.49 kg/m.  SVE 4/70/-2 AROM clear fluid  FHR:  145bpm, mod variability, + accels, no decels Toco:  ctx q 2-3 mins  CBC    Component Value Date/Time   WBC 9.7 04/19/2023 2031   RBC 4.42 04/19/2023 2031   HGB 11.6 (L) 04/19/2023 2031   HGB 11.0 06/03/2020 0000   HCT 35.5 (L) 04/19/2023 2031   HCT 34 06/03/2020 0000   PLT 250 04/19/2023 2031   PLT 312 06/03/2020 0000   MCV 80.3 04/19/2023 2031   MCH 26.2 04/19/2023 2031   MCHC 32.7 04/19/2023 2031   RDW 13.7 04/19/2023 2031   CMP     Component Value Date/Time   NA 137 04/19/2023 2031   K 3.5 04/19/2023 2031   CL 108 04/19/2023 2031   CO2 19 (L) 04/19/2023 2031   GLUCOSE 106 (H) 04/19/2023 2031   BUN 11 04/19/2023 2031   CREATININE 0.64 04/19/2023 2031   CALCIUM 9.4 04/19/2023 2031   PROT 6.6 04/19/2023 2031   ALBUMIN 2.7 (L) 04/19/2023 2031   AST 20 04/19/2023 2031   ALT 18 04/19/2023 2031   ALKPHOS 109 04/19/2023 2031   BILITOT 0.3 04/19/2023 2031   GFRNONAA >60 04/19/2023 2031    Latest Reference Range & Units 04/19/23 23:30  Protein Creatinine Ratio 0.00 - 0.15 mg/mgCre 0.06    A/P: 37Y G3P2002 @ [redacted]w[redacted]d, IOL GHTN Fetal wellbeing: cat I tracing IOL: s/p AROM, titrate pitocin  as needed. Pelvic proven to 8lb9oz, anticipate SVD.  GHTN: admission labs normal, normal to mild range blood pressures since arrival to L&D. Monitor for signs/symptoms of severe preeclampsia.  Pain control: epidural     M. Diedre, MD 04/20/23 12:23 AM

## 2023-04-20 NOTE — Lactation Note (Signed)
 This note was copied from a baby's chart. Lactation Consultation Note  Patient Name: Margaret Schmidt Unijb'd Date: 04/20/2023 Age:37 hours Reason for consult: Initial assessment;Early term 37-38.6wks, See MOB-MR hx GHTN  FOB used as interpreter instead of Ipad. MOB latched infant on her left breast with pillow support using cross cradle hold, infant latched with depth and still BF after 18 minutes when LC left the room. MOB is experienced with breastfeeding see maternal data below. MOB will continue to BF infant by cues, on demand, every 2-3 hours, skin to skin. MOB knows to call for latch assistance if needed. LC discussed importance of maternal rest, balance meals and snacks and hydration. MOB was made aware of O/P services, breastfeeding support groups, community resources, and our phone # for post-discharge questions.    Maternal Data Has patient been taught Hand Expression?: Yes Does the patient have breastfeeding experience prior to this delivery?: Yes How long did the patient breastfeed?: MOB BF 2nd child for 14 months, she BF and supplemented with formula.  Feeding Mother's Current Feeding Choice: Breast Milk  LATCH Score Latch: Grasps breast easily, tongue down, lips flanged, rhythmical sucking.  Audible Swallowing: A few with stimulation  Type of Nipple: Everted at rest and after stimulation  Comfort (Breast/Nipple): Soft / non-tender  Hold (Positioning): Assistance needed to correctly position infant at breast and maintain latch.  LATCH Score: 8   Lactation Tools Discussed/Used    Interventions Interventions: Breast feeding basics reviewed;Assisted with latch;Skin to skin;Breast compression;Adjust position;Support pillows;Position options;Education;LC Services brochure  Discharge Pump: DEBP;Personal  Consult Status Consult Status: Follow-up Date: 04/21/23 Follow-up type: In-patient    Margaret Schmidt 04/20/2023, 4:49 PM

## 2023-04-20 NOTE — Progress Notes (Signed)
 Mother declined use of [employer] Interpreter during admission assessment and education.  Patient stated she would tell nursing staff if she felt an interpreter needed to be called for assistance.  Husband also in the room and understands [place].

## 2023-04-20 NOTE — Progress Notes (Addendum)
 Patient ID: Margaret Schmidt, female   DOB: 10-Oct-1985, 38 y.o.   MRN: [id number] Pt reports increased pelvic pressure with contractions.. No HA VSS GEN - NAD EFM - 125, mod variability, + accels, no decels ; cat 1 TOCO - ctxs q 3 mins  SVE - complete, 0 station   A/P: 63bn [id number] female at 38wks, ghtn           - Plan to start pushing now          - Anticipate svd          - BP wnl

## 2023-04-21 LAB — CBC
HCT: 32.6 % — ABNORMAL LOW (ref 36.0–46.0)
Hemoglobin: 10.5 g/dL — ABNORMAL LOW (ref 12.0–15.0)
MCH: 26.3 pg (ref 26.0–34.0)
MCHC: 32.2 g/dL (ref 30.0–36.0)
MCV: 81.7 fL (ref 80.0–100.0)
Platelets: 191 10*3/uL (ref 150–400)
RBC: 3.99 MIL/uL (ref 3.87–5.11)
RDW: 13.8 % (ref 11.5–15.5)
WBC: 10.6 10*3/uL — ABNORMAL HIGH (ref 4.0–10.5)
nRBC: 0 % (ref 0.0–0.2)

## 2023-04-21 MED ORDER — IBUPROFEN 600 MG PO TABS: 600.0000 mg | ORAL_TABLET | Freq: Four times a day (QID) | ORAL | 0 refills | Status: AC | PRN

## 2023-04-21 NOTE — Progress Notes (Signed)
 Post Partum Day 1 Subjective: no complaints, up ad lib, voiding, tolerating PO, and + flatus hoping for early discharge home  Objective: Blood pressure 129/78, pulse 76, temperature 98.3 F (36.8 C), temperature source Oral, resp. rate 18, height 5' 7 (1.702 m), weight 105.7 kg, SpO2 100%, unknown if currently breastfeeding.  Physical Exam:  General: alert, cooperative, and appears stated age Lochia: appropriate Uterine Fundus: firm DVT Evaluation: No evidence of DVT seen on physical exam.  Recent Labs    04/19/23 2031 04/21/23 0514  HGB 11.6* 10.5*  HCT 35.5* 32.6*    Assessment/Plan: Discharge home peds releases baby Routine postpartum care Breast feeding Blood pressures have been normotensive since delivery  LOS: 2 days   Marjorie Gull, MD 04/21/2023, 1:48 PM

## 2023-04-21 NOTE — Plan of Care (Signed)
 Problem: Health Behavior/Discharge Planning: Goal: Ability to manage health-related needs will improve 04/21/2023 1356 by Madison Rosina LABOR, LPN Outcome: Adequate for Discharge 04/21/2023 0721 by Madison Rosina LABOR, LPN Outcome: Progressing   Problem: Clinical Measurements: Goal: Ability to maintain clinical measurements within normal limits will improve 04/21/2023 1356 by Madison Rosina LABOR, LPN Outcome: Adequate for Discharge 04/21/2023 0721 by Madison Rosina LABOR, LPN Outcome: Progressing Goal: Will remain free from infection 04/21/2023 1356 by Madison Rosina LABOR, LPN Outcome: Adequate for Discharge 04/21/2023 0721 by Madison Rosina LABOR, LPN Outcome: Progressing Goal: Diagnostic test results will improve 04/21/2023 1356 by Madison Rosina LABOR, LPN Outcome: Adequate for Discharge 04/21/2023 0721 by Madison Rosina LABOR, LPN Outcome: Progressing   Problem: Activity: Goal: Risk for activity intolerance will decrease 04/21/2023 1356 by Madison Rosina LABOR, LPN Outcome: Adequate for Discharge 04/21/2023 0721 by Madison Rosina LABOR, LPN Outcome: Progressing   Problem: Coping: Goal: Level of anxiety will decrease 04/21/2023 1356 by Madison Rosina LABOR, LPN Outcome: Adequate for Discharge 04/21/2023 0721 by Madison Rosina LABOR, LPN Outcome: Progressing   Problem: Elimination: Goal: Will not experience complications related to bowel motility 04/21/2023 1356 by Madison Rosina LABOR, LPN Outcome: Adequate for Discharge 04/21/2023 0721 by Madison Rosina LABOR, LPN Outcome: Progressing Goal: Will not experience complications related to urinary retention 04/21/2023 1356 by Madison Rosina LABOR, LPN Outcome: Adequate for Discharge 04/21/2023 0721 by Madison Rosina LABOR, LPN Outcome: Progressing   Problem: Safety: Goal: Ability to remain free from injury will improve 04/21/2023 1356 by Madison Rosina LABOR, LPN Outcome: Adequate for Discharge 04/21/2023 0721 by Madison Rosina LABOR, LPN Outcome: Progressing   Problem: Skin Integrity: Goal: Risk for impaired skin integrity  will decrease 04/21/2023 1356 by Madison Rosina LABOR, LPN Outcome: Adequate for Discharge 04/21/2023 0721 by Madison Rosina LABOR, LPN Outcome: Progressing   Problem: Education: Goal: Knowledge of the prescribed therapeutic regimen will improve 04/21/2023 1356 by Madison Rosina LABOR, LPN Outcome: Adequate for Discharge 04/21/2023 0721 by Madison Rosina LABOR, LPN Outcome: Progressing   Problem: Fluid Volume: Goal: Peripheral tissue perfusion will improve 04/21/2023 1356 by Madison Rosina LABOR, LPN Outcome: Adequate for Discharge 04/21/2023 0721 by Madison Rosina LABOR, LPN Outcome: Progressing   Problem: Clinical Measurements: Goal: Complications related to disease process, condition or treatment will be avoided or minimized 04/21/2023 1356 by Madison Rosina LABOR, LPN Outcome: Adequate for Discharge 04/21/2023 0721 by Madison Rosina LABOR, LPN Outcome: Progressing   Problem: Education: Goal: Individualized Educational Video(s) 04/21/2023 1356 by Madison Rosina LABOR, LPN Outcome: Adequate for Discharge 04/21/2023 0721 by Madison Rosina LABOR, LPN Outcome: Progressing Goal: Individualized Newborn Educational Video(s) 04/21/2023 1356 by Madison Rosina LABOR, LPN Outcome: Adequate for Discharge 04/21/2023 0721 by Madison Rosina LABOR, LPN Outcome: Progressing   Problem: Activity: Goal: Will verbalize the importance of balancing activity with adequate rest periods 04/21/2023 1356 by Madison Rosina LABOR, LPN Outcome: Adequate for Discharge 04/21/2023 0721 by Madison Rosina LABOR, LPN Outcome: Progressing Goal: Ability to tolerate increased activity will improve 04/21/2023 1356 by Madison Rosina LABOR, LPN Outcome: Adequate for Discharge 04/21/2023 0721 by Madison Rosina LABOR, LPN Outcome: Progressing   Problem: Coping: Goal: Ability to identify and utilize available resources and services will improve 04/21/2023 1356 by Madison Rosina LABOR, LPN Outcome: Adequate for Discharge 04/21/2023 0721 by Madison Rosina LABOR, LPN Outcome: Progressing   Problem: Life Cycle: Goal: Chance of  risk for complications during the postpartum period will decrease 04/21/2023 1356 by Madison Rosina LABOR, LPN Outcome: Adequate for Discharge 04/21/2023 0721 by Madison Rosina  A, LPN Outcome: Progressing   Problem: Role Relationship: Goal: Ability to demonstrate positive interaction with newborn will improve 04/21/2023 1356 by Madison Rosina LABOR, LPN Outcome: Adequate for Discharge 04/21/2023 0721 by Madison Rosina LABOR, LPN Outcome: Progressing

## 2023-04-21 NOTE — Discharge Instructions (Signed)
 WHAT TO LOOK OUT FOR: Fever of 100.4 or above Mastitis: feels like flu and breasts hurt Infection: increased pain, swelling or redness Blood clots golf ball size or larger Postpartum depression   Congratulations on your newest addition!

## 2023-04-21 NOTE — Plan of Care (Signed)
  Problem: Health Behavior/Discharge Planning: Goal: Ability to manage health-related needs will improve Outcome: Progressing   Problem: Clinical Measurements: Goal: Ability to maintain clinical measurements within normal limits will improve Outcome: Progressing Goal: Will remain free from infection Outcome: Progressing Goal: Diagnostic test results will improve Outcome: Progressing   Problem: Activity: Goal: Risk for activity intolerance will decrease Outcome: Progressing   Problem: Coping: Goal: Level of anxiety will decrease Outcome: Progressing   Problem: Elimination: Goal: Will not experience complications related to bowel motility Outcome: Progressing Goal: Will not experience complications related to urinary retention Outcome: Progressing   Problem: Safety: Goal: Ability to remain free from injury will improve Outcome: Progressing   Problem: Skin Integrity: Goal: Risk for impaired skin integrity will decrease Outcome: Progressing   Problem: Education: Goal: Knowledge of the prescribed therapeutic regimen will improve Outcome: Progressing   Problem: Fluid Volume: Goal: Peripheral tissue perfusion will improve Outcome: Progressing   Problem: Clinical Measurements: Goal: Complications related to disease process, condition or treatment will be avoided or minimized Outcome: Progressing   Problem: Education: Goal: Individualized Educational Video(s) Outcome: Progressing Goal: Individualized Newborn Educational Video(s) Outcome: Progressing   Problem: Activity: Goal: Will verbalize the importance of balancing activity with adequate rest periods Outcome: Progressing Goal: Ability to tolerate increased activity will improve Outcome: Progressing   Problem: Coping: Goal: Ability to identify and utilize available resources and services will improve Outcome: Progressing   Problem: Life Cycle: Goal: Chance of risk for complications during the postpartum period  will decrease Outcome: Progressing   Problem: Role Relationship: Goal: Ability to demonstrate positive interaction with newborn will improve Outcome: Progressing

## 2023-04-21 NOTE — Discharge Summary (Signed)
 Postpartum Discharge Summary       Patient Name: Margaret Schmidt DOB: 29-Sep-1985 MRN: [id number]  Date of admission: 04/19/2023 Delivery date:04/20/2023 Delivering provider: DELANA TED MORRISON Date of discharge: 04/21/2023  Admitting diagnosis: Gestational hypertension [O13.9] Intrauterine pregnancy: [redacted]w[redacted]d     Secondary diagnosis:  Principal Problem:   Gestational hypertension Active Problems:   SVD (spontaneous vaginal delivery)  Additional problems: Advanced maternal age    Discharge diagnosis: Term Pregnancy Delivered                                              Post partum procedures:rhogam Augmentation: AROM and Pitocin  Complications: None  Hospital course: Induction of Labor With Vaginal Delivery   38 y.o. yo G3P3003 at [redacted]w[redacted]d was admitted to the hospital 04/19/2023 for induction of labor.  Indication for induction: Gestational hypertension.  Patient had an labor course complicated byNothing Membrane Rupture Time/Date: 12:15 AM,04/20/2023  Delivery Method:Vaginal, Spontaneous Operative Delivery:N/A Episiotomy: None Lacerations:  Perineal Details of delivery can be found in separate delivery note.  Patient had a postpartum course complicated by nothing. Patient is discharged home 04/21/23.  Newborn Data: Birth date:04/20/2023 Birth time:10:55 AM Gender:Female Living status:Living Apgars:8 ,9  Weight:3270 g  Magnesium  Sulfate received: No BMZ received: No Rhophylac:Yes Immunizations administered: There is no immunization history for the selected administration types on file for this patient.  Physical exam  Vitals:   04/20/23 1802 04/20/23 2201 04/21/23 0216 04/21/23 0519  BP: 120/75 133/77 125/79 129/78  Pulse: 68 64 72 76  Resp: 18 18 18 18   Temp: 98.8 F (37.1 C) 98.3 F (36.8 C) 98.1 F (36.7 C) 98.3 F (36.8 C)  TempSrc: Oral Oral Oral Oral  SpO2:  100% 100% 100%  Weight:      Height:       General: alert, cooperative, and no distress Lochia:  appropriate Uterine Fundus: firm DVT Evaluation: No evidence of DVT seen on physical exam. Labs: Lab Results  Component Value Date   WBC 10.6 (H) 04/21/2023   HGB 10.5 (L) 04/21/2023   HCT 32.6 (L) 04/21/2023   MCV 81.7 04/21/2023   PLT 191 04/21/2023      Latest Ref Rng & Units 04/19/2023    8:31 PM  CMP  Glucose 70 - 99 mg/dL 893   BUN 6 - 20 mg/dL 11   Creatinine 9.55 - 1.00 mg/dL 9.35   Sodium 864 - 854 mmol/L 137   Potassium 3.5 - 5.1 mmol/L 3.5   Chloride 98 - 111 mmol/L 108   CO2 22 - 32 mmol/L 19   Calcium 8.9 - 10.3 mg/dL 9.4   Total Protein 6.5 - 8.1 g/dL 6.6   Total Bilirubin 0.0 - 1.2 mg/dL 0.3   Alkaline Phos 38 - 126 U/L 109   AST 15 - 41 U/L 20   ALT 0 - 44 U/L 18    Edinburgh Score:    04/20/2023    1:20 PM  Edinburgh Postnatal Depression Scale Screening Tool  I have been able to laugh and see the funny side of things. 0  I have looked forward with enjoyment to things. 0  I have blamed myself unnecessarily when things went wrong. 0  I have been anxious or worried for no good reason. 0  I have felt scared or panicky for no good reason. 0  Things have been  getting on top of me. 0  I have been so unhappy that I have had difficulty sleeping. 0  I have felt sad or miserable. 0  I have been so unhappy that I have been crying. 0  The thought of harming myself has occurred to me. 0  Edinburgh Postnatal Depression Scale Total 0      After visit meds:  Allergies as of 04/21/2023       Reactions   Pork-derived Products         Medication List     STOP taking these medications    acetaminophen  325 MG tablet Commonly known as: Tylenol        TAKE these medications    ibuprofen  600 MG tablet Commonly known as: ADVIL  Take 1 tablet (600 mg total) by mouth every 6 (six) hours as needed. What changed:  medication strength how much to take when to take this   prenatal multivitamin Tabs tablet Take 1 tablet by mouth daily at 12 noon.                Discharge Care Instructions  (From admission, onward)           Start     Ordered   04/21/23 0000  Discharge wound care:       Comments: For a cesarean delivery: You may wash incision with soap and water.  Do not soak or submerge the incision for 2 weeks. Keep incision dry. You may need to keep a sanitary pad or panty liner between the incision and your clothing for comfort and to keep the incision dry. If you note drainage, increased pain, or increased redness of the incision, then please notify your physician.   04/21/23 1445   04/21/23 0000  If the dressing is still on your incision site when you go home, remove it on the third day after your surgery date. Remove dressing if it begins to fall off, or if it is dirty or damaged before the third day.       Comments: For a cesarean delivery   04/21/23 1445             Discharge home in stable condition Infant Feeding: Breast Infant Disposition:home with mother Discharge instruction: per After Visit Summary and Postpartum booklet. Activity: Advance as tolerated. Pelvic rest for 6 weeks.  Diet: routine diet Anticipated Birth Control: Unsure Postpartum Appointment:1 week Future Appointments:No future appointments. Follow up Visit:  Follow-up Information     Ob/Gyn, Landy Stains Follow up in 1 week(s).   Why: For a BP check Contact information: [address] [place] [address] [phone]                     04/21/2023 Marjorie Gull, MD

## 2023-04-21 NOTE — Progress Notes (Addendum)
 Patient did not want to use cone . Interpreter States that she understood what was being said to her and that if she did not she would get her husband to interprer. Wavier form  for Interpreter was sign and is in chart.

## 2023-04-27 ENCOUNTER — Inpatient Hospital Stay (HOSPITAL_COMMUNITY)
Admission: AD | Admit: 2023-04-27 | Discharge: 2023-04-27 | Disposition: A | Discharge status: Home / Self Care | Payer: Medicaid Other | Attending: Student | Admitting: Student

## 2023-04-27 ENCOUNTER — Other Ambulatory Visit: Payer: Self-pay

## 2023-04-27 ENCOUNTER — Encounter (HOSPITAL_COMMUNITY): Payer: Self-pay | Admitting: Student

## 2023-04-27 DIAGNOSIS — O9089 Other complications of the puerperium, not elsewhere classified: Secondary | ICD-10-CM | POA: Diagnosis present

## 2023-04-27 DIAGNOSIS — R519 Headache, unspecified: Secondary | ICD-10-CM | POA: Diagnosis not present

## 2023-04-27 LAB — COMPREHENSIVE METABOLIC PANEL
ALT: 20 U/L (ref 0–44)
AST: 19 U/L (ref 15–41)
Albumin: 3 g/dL — ABNORMAL LOW (ref 3.5–5.0)
Alkaline Phosphatase: 82 U/L (ref 38–126)
Anion gap: 11 (ref 5–15)
BUN: 14 mg/dL (ref 6–20)
CO2: 21 mmol/L — ABNORMAL LOW (ref 22–32)
Calcium: 9.3 mg/dL (ref 8.9–10.3)
Chloride: 107 mmol/L (ref 98–111)
Creatinine, Ser: 0.77 mg/dL (ref 0.44–1.00)
GFR, Estimated: >60 mL/min (ref >60)
Glucose, Bld: 73 mg/dL (ref 70–99)
Potassium: 4 mmol/L (ref 3.5–5.1)
Sodium: 139 mmol/L (ref 135–145)
Total Bilirubin: 0.5 mg/dL (ref 0.0–1.2)
Total Protein: 6.6 g/dL (ref 6.5–8.1)

## 2023-04-27 LAB — PROTEIN / CREATININE RATIO, URINE
Creatinine, Urine: 54 mg/dL
Protein Creatinine Ratio: 0.13 mg/mg{creat} (ref 0.00–0.15)
Total Protein, Urine: 7 mg/dL

## 2023-04-27 MED ORDER — LABETALOL HCL 5 MG/ML IV SOLN: 40.0000 mg | INTRAVENOUS | Status: DC | PRN

## 2023-04-27 MED ORDER — ACETAMINOPHEN-CAFFEINE 500-65 MG PO TABS
2.0000 | ORAL_TABLET | Freq: Once | ORAL | Status: AC
Start: 2023-04-27 — End: 2023-04-27
  Administered 2023-04-27: 2 via ORAL
  Filled 2023-04-27: qty 2

## 2023-04-27 MED ORDER — LABETALOL HCL 5 MG/ML IV SOLN: 80.0000 mg | INTRAVENOUS | Status: DC | PRN

## 2023-04-27 MED ORDER — NIFEDIPINE ER OSMOTIC RELEASE 30 MG PO TB24: 30.0000 mg | ORAL_TABLET | Freq: Every day | ORAL | 0 refills | Status: AC

## 2023-04-27 MED ORDER — HYDRALAZINE HCL 20 MG/ML IJ SOLN: 10.0000 mg | INTRAMUSCULAR | Status: DC | PRN

## 2023-04-27 MED ORDER — LABETALOL HCL 5 MG/ML IV SOLN
20.0000 mg | INTRAVENOUS | Status: DC | PRN
  Filled 2023-04-27: qty 4

## 2023-04-27 NOTE — MAU Provider Note (Signed)
History     CSN: [id number]  Arrival date and time: 04/27/23 1100   None     Chief Complaint  Patient presents with   Hypertension   Headache   HPI [name] is a 38 y.o. year old G64P3003 female  who presents to MAU reporting ***.  OB History     Gravida  3   Para  3   Term  3   Preterm      AB      Living  3      SAB      IAB      Ectopic      Multiple  0   Live Births  3           Past Medical History:  Diagnosis Date   Infertility, female    Pregnancy induced hypertension     Past Surgical History:  Procedure Laterality Date   DILATION AND CURETTAGE OF UTERUS N/A 10/16/2019   Procedure: DILATATION AND CURETTAGE;  Surgeon: Waynard Reeds, MD;  Location: [facility];  Service: Gynecology;  Laterality: N/A;   DILATION AND EVACUATION N/A 01/22/2021   Procedure: DILATATION AND EVACUATION;  Surgeon: Carlisle Cater, MD;  Location: [facility] OR;  Service: Gynecology;  Laterality: N/A;    Family History  Problem Relation Age of Onset   Cancer Neg Hx    Diabetes Neg Hx    Heart disease Neg Hx     Social History   Tobacco Use   Smoking status: Never   Smokeless tobacco: Never  Vaping Use   Vaping status: Never Used  Substance Use Topics   Alcohol use: Never   Drug use: Never    Allergies:  Allergies  Allergen Reactions   Pork-Derived Products     Medications Prior to Admission  Medication Sig Dispense Refill Last Dose/Taking   ibuprofen (ADVIL) 600 MG tablet Take 1 tablet (600 mg total) by mouth every 6 (six) hours as needed. 90 tablet 0 Past Week   Prenatal Vit-Fe Fumarate-FA (PRENATAL MULTIVITAMIN) TABS tablet Take 1 tablet by mouth daily at 12 noon.   Past Week    Review of Systems  Constitutional: Negative.   HENT: Negative.    Eyes: Negative.   Respiratory: Negative.    Cardiovascular: Negative.   Gastrointestinal: Negative.   Endocrine: Negative.   Musculoskeletal: Negative.   Skin: Negative.   Allergic/Immunologic:  Negative.   Neurological:  Positive for headaches ("going away since arriving to MAU and getting rest away from other children and newborn").  Hematological: Negative.   Psychiatric/Behavioral: Negative.     Physical Exam   Patient Vitals for the past 24 hrs:  BP Temp Pulse Resp Height Weight  04/27/23 1500 133/79 -- 65 -- -- --  04/27/23 1445 132/79 -- 67 -- -- --  04/27/23 1430 139/69 -- 70 16 -- --  04/27/23 1415 (!) 141/73 -- 63 -- -- --  04/27/23 1400 (!) 143/78 -- 63 -- -- --  04/27/23 1345 136/78 -- 67 -- -- --  04/27/23 1330 (!) 151/90 -- 60 16 -- --  04/27/23 1315 (!) 147/93 -- 68 -- -- --  04/27/23 1300 (!) 141/98 -- 71 16 -- --  04/27/23 1246 (!) 161/90 -- 67 -- -- --  04/27/23 1231 (!) 148/94 -- 63 16 -- --  04/27/23 1220 (!) 151/93 -- 64 16 -- --  04/27/23 1154 (!) 148/92 -- 74 15 -- --  04/27/23 1143 (!) 175/97 97.8 F (  36.6 C) 82 18 5\' 7"  (1.702 m) 101.2 kg    Physical Exam Vitals and nursing note reviewed.  Constitutional:      Appearance: Normal appearance. She is obese.  HENT:     Head: Normocephalic and atraumatic.  Cardiovascular:     Rate and Rhythm: Normal rate and regular rhythm.     Pulses: Normal pulses.     Heart sounds: Normal heart sounds.  Pulmonary:     Effort: Pulmonary effort is normal.     Breath sounds: Normal breath sounds.  Abdominal:     Palpations: Abdomen is soft.  Musculoskeletal:        General: Normal range of motion.  Skin:    General: Skin is warm and dry.  Neurological:     Mental Status: She is alert and oriented to person, place, and time.  Psychiatric:        Mood and Affect: Mood normal.        Behavior: Behavior normal.        Thought Content: Thought content normal.        Judgment: Judgment normal.   Reassessment @ 1620 (well after being d/c'd home): Patient declining to be d/c'd until medication for BP being sent to pharmacy is sent per Arabic interpreter. Explained to patient and interpreter that medication was  not needed due to BPs being mild range and improved once H/A resolved. Per interpreter, "the patient and her husband (on the phone) want the medication sent anyway, because once she gets home her BP will be 160/100 something again and they don't want her to have to come back over here in the middle of the night."  MAU Course  Procedures  MDM CCUA CBC CMP P/C Ratio Serial BP's  Excedrin Tension H/A caplets  Results for orders placed or performed during the hospital encounter of 04/27/23 (from the past 24 hours)  Comprehensive metabolic panel     Status: Abnormal   Collection Time: 04/27/23  1:13 PM  Result Value Ref Range   Sodium 139 135 - 145 mmol/L   Potassium 4.0 3.5 - 5.1 mmol/L   Chloride 107 98 - 111 mmol/L   CO2 21 (L) 22 - 32 mmol/L   Glucose, Bld 73 70 - 99 mg/dL   BUN 14 6 - 20 mg/dL   Creatinine, Ser 6.38 0.44 - 1.00 mg/dL   Calcium 9.3 8.9 - 75.6 mg/dL   Total Protein 6.6 6.5 - 8.1 g/dL   Albumin 3.0 (L) 3.5 - 5.0 g/dL   AST 19 15 - 41 U/L   ALT 20 0 - 44 U/L   Alkaline Phosphatase 82 38 - 126 U/L   Total Bilirubin 0.5 0.0 - 1.2 mg/dL   GFR, Estimated >43 >32 mL/min   Anion gap 11 5 - 15  Protein / creatinine ratio, urine     Status: None   Collection Time: 04/27/23  1:14 PM  Result Value Ref Range   Creatinine, Urine 54 mg/dL   Total Protein, Urine 7 mg/dL   Protein Creatinine Ratio 0.13 0.00 - 0.15 mg/mg[Cre]     Assessment and Plan  1. Postpartum headache (Primary) - Discharge patient - Prescription for: Procardia 30 mg po daily - F/U with  [facility] in 1 week for BP recheck - Advised that the medication Rx'd will cause H/A and she will need to take medication for H/As - Patient verbalized an understanding of the plan of care and agrees.   Margaret Schmidt, CNM 04/27/2023,  4:20 PM

## 2023-04-27 NOTE — MAU Note (Signed)
.  Margaret Schmidt is a 38 y.o. at Unknown here in MAU reporting: pp 04/20/2023 vag delivery (pre e). Stated when she got home started having headaches, has been monitoring b/p and it has been elevated but today it was very high 170/114. Has taken tylenol and ibuprofen has helped a little bit but comes back. Not on B/P meds. Denies any visual problems or abd pain or swelling.  LMP:  Onset of complaint:  4-5 days Pain score: 5 Vitals:   04/27/23 1143  BP: (!) 175/97  Pulse: 82  Resp: 18  Temp: 97.8 F (36.6 C)     FHT: n/a  Lab orders placed from triage:

## 2023-04-27 NOTE — Discharge Instructions (Addendum)
???? ???   MAU ??? ??? ??? ???? >/= 160/110. ???? ?????? ????? ??????? ?????? ??? ????? ??? ????? ?????. arjie 'iilaa MAU 'iidha kan daght aldam >/= 160/110. yurjaa alsamah lijismik bialraahat wa'iieadat shahn taqatih baed 'iinjab altifli.

## 2023-05-01 ENCOUNTER — Telehealth (HOSPITAL_COMMUNITY): Payer: Self-pay | Admitting: *Deleted

## 2023-05-01 NOTE — Telephone Encounter (Signed)
05/01/2023  Name: Margaret Schmidt MRN: [id number] DOB: [birth year]  Reason for Call:  Transition of Care Hospital Discharge Call  Contact Status: Patient Contact Status: Message  Language assistant needed: Interpreter Mode: Telephonic Interpreter Interpreter Name: Donita Brooks [phone]        Follow-Up Questions:    Inocente Salles Postnatal Depression Scale:  In the Past 7 Days:    PHQ2-9 Depression Scale:     Discharge Follow-up:    Post-discharge interventions: NA  Salena Saner, RN 05/01/2023 13:52

## 2023-05-15 IMAGING — US US PELVIS COMPLETE
1 series · 15 of 24 positions shown · non-contrast
Comparison: None.

CLINICAL DATA: Postpartum bleeding following delivery on 12/17/2020

EXAM:
TRANSABDOMINAL ULTRASOUND OF PELVIS
TECHNIQUE: Transabdominal ultrasound examination of the pelvis was performed
including evaluation of the uterus, ovaries, adnexal regions, and
pelvic cul-de-sac.

[Series 1: us pelvis complete · 24 acquisitions, 15 frames shown]
[im 1/24]
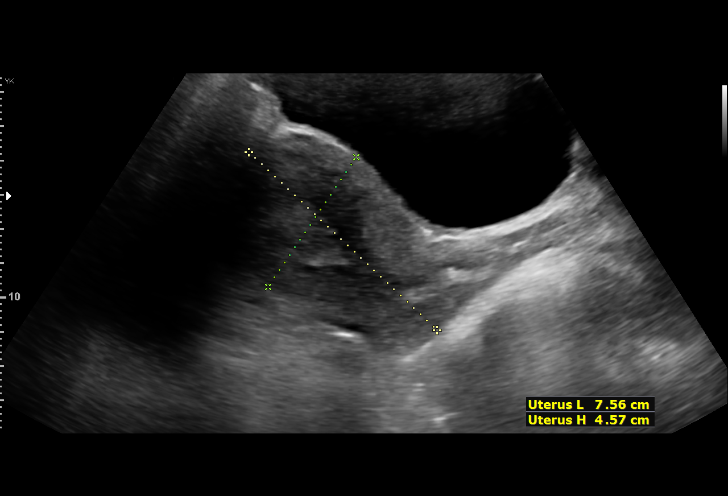
[im 3/24]
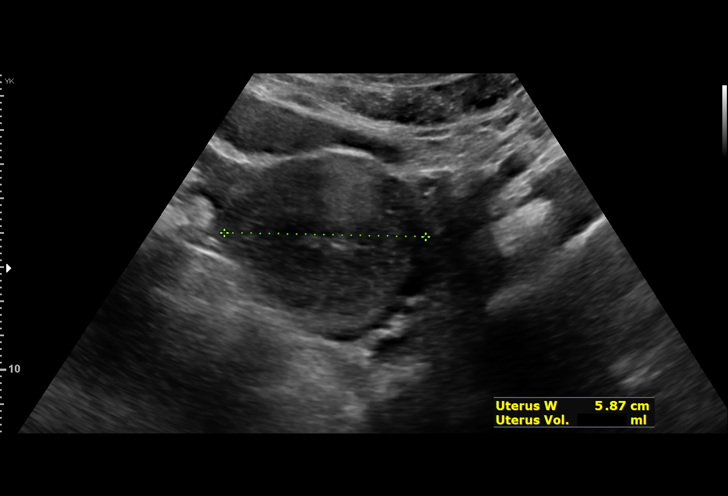
[im 5/24]
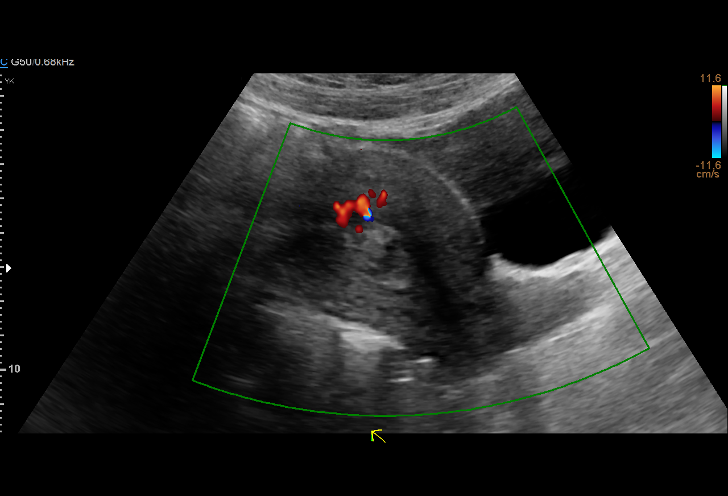
[im 6/24]
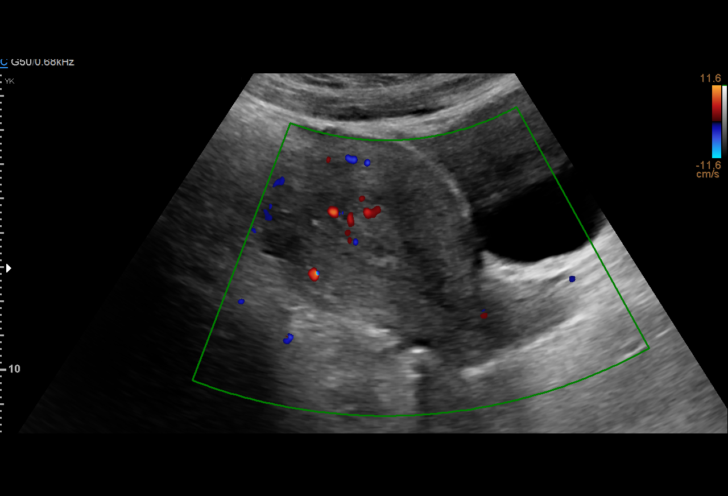
[im 8/24]
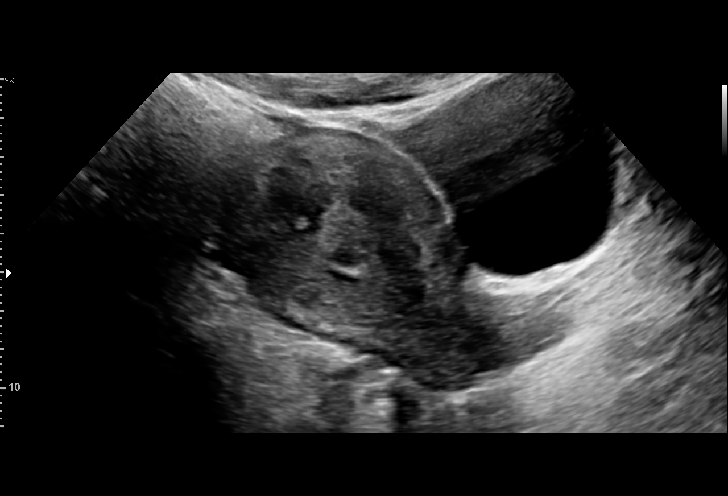
[im 9/24]
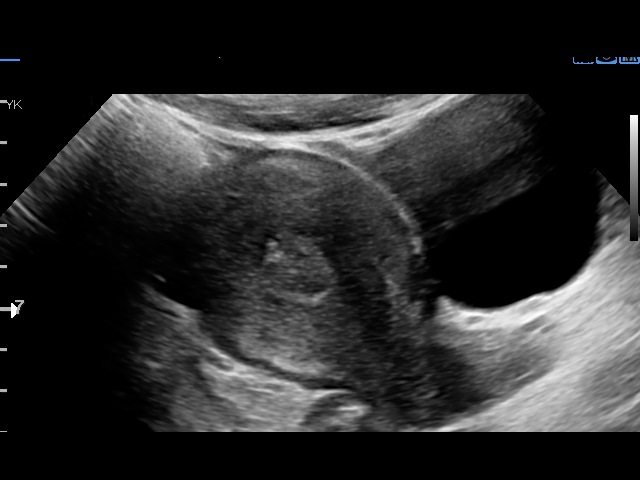
[im 11/24]
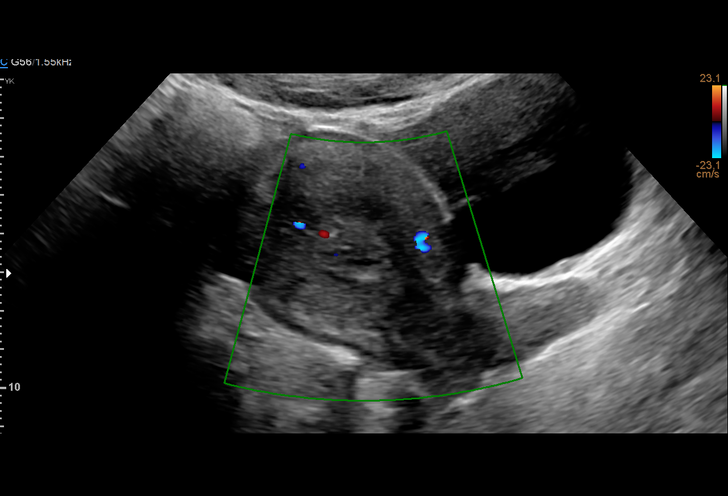
[im 13/24]
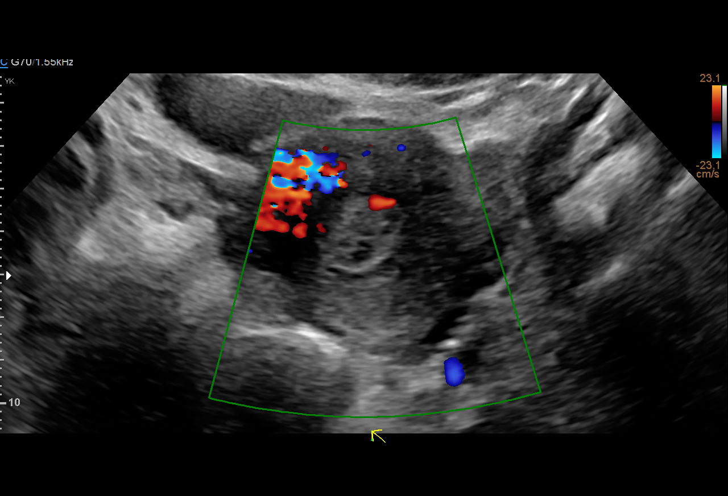
[im 14/24]
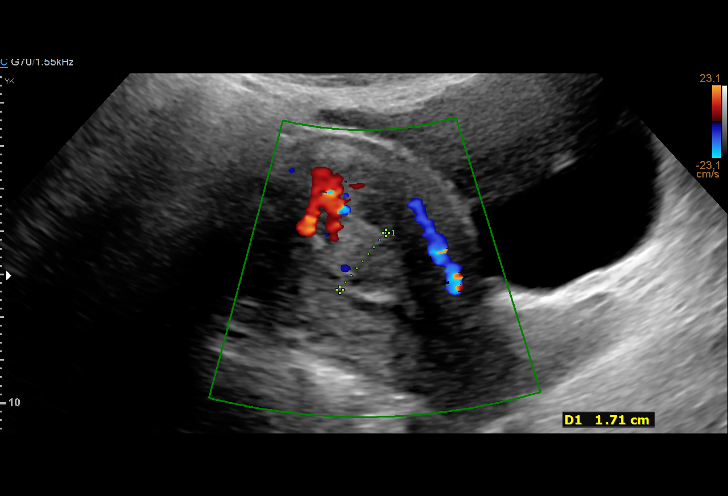
[im 16/24]
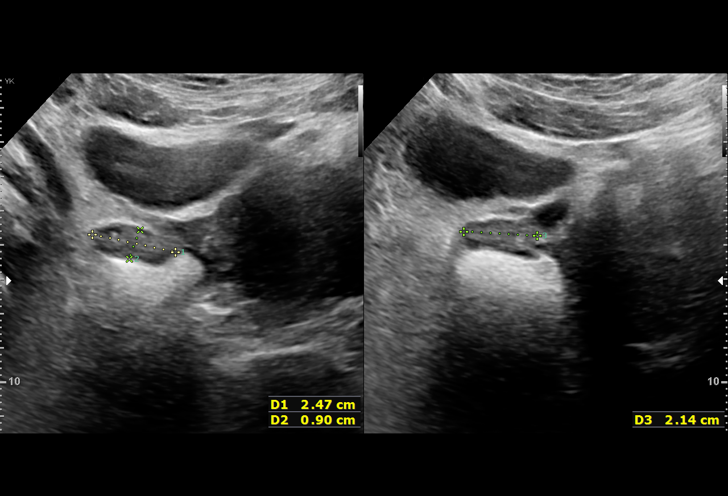
[im 17/24]
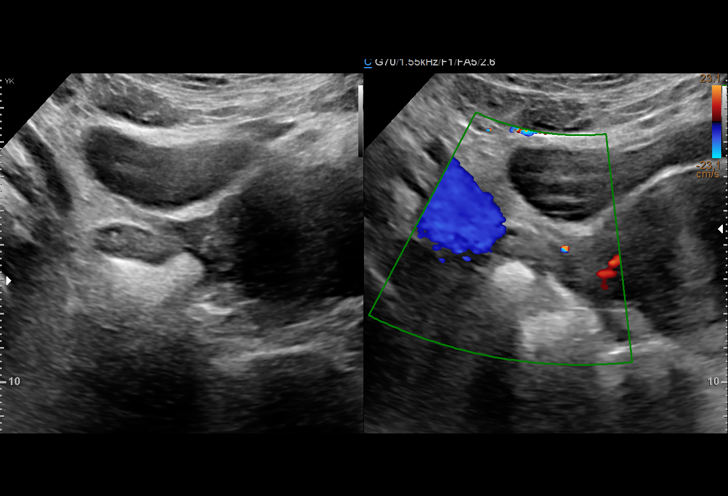
[im 19/24]
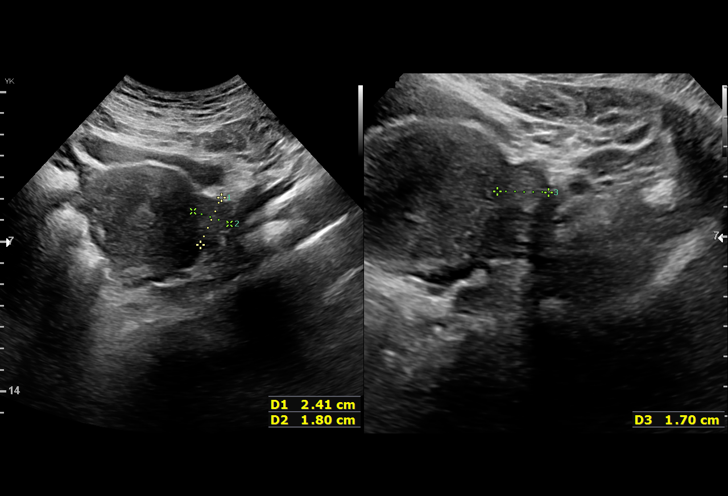
[im 21/24]
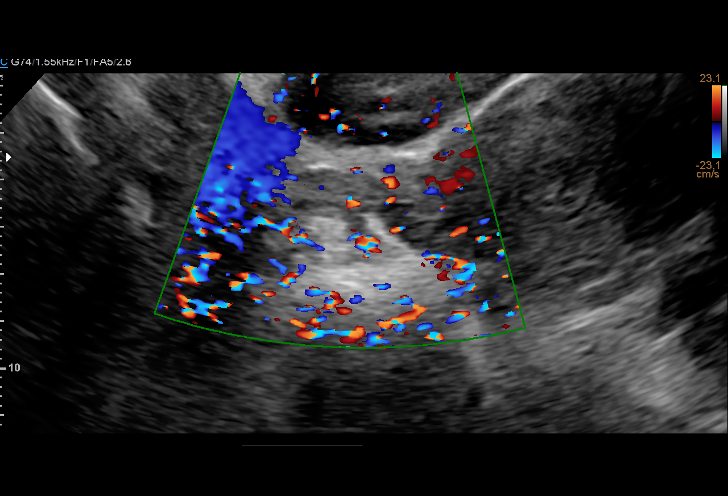
[im 22/24]
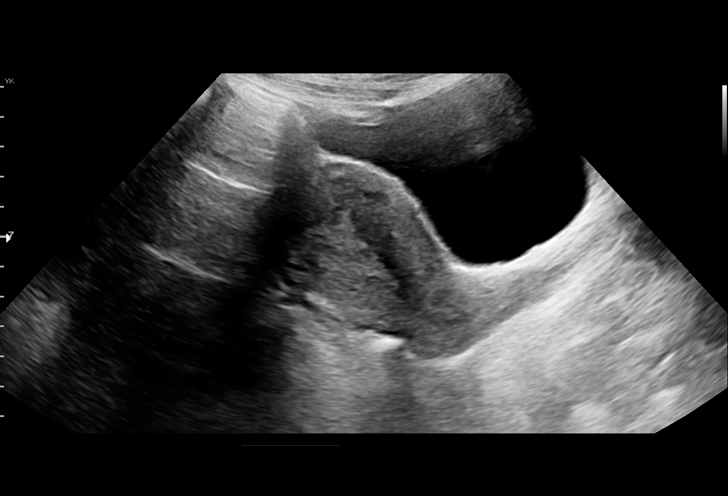
[im 24/24]
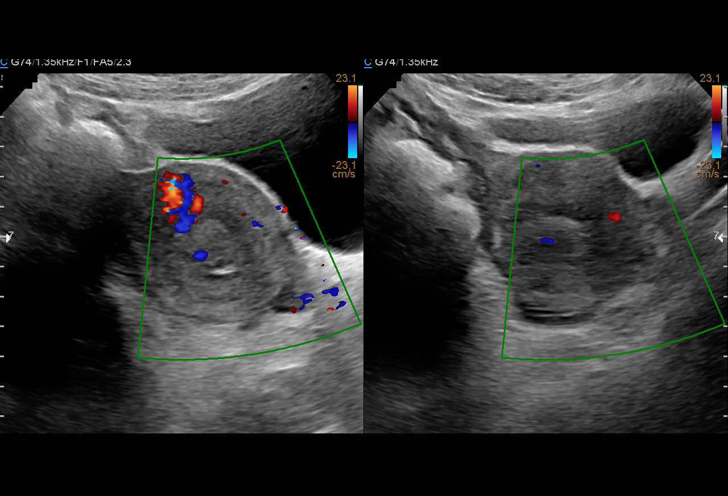

[15 of 24 positions shown; findings below may reference images not displayed]

FINDINGS: Uterus

Measurements: 7.6 x 4.6 x 5.9 cm = volume: 106 mL. No fibroids or
other mass visualized.

Endometrium

Thickness: 17 mm. Nodular area of heterogeneously hyperechoic tissue
within the endometrium measuring approximately 2.2 x 1.6 x 2.0 cm
with internal vascularity. No fluid within the endometrial canal.

Right ovary

Measurements: 2.5 x 0.9 x 2.1 cm = volume: 2 mL. Normal
appearance/no adnexal mass.

Left ovary

Measurements: 2.4 x 1.8 x 1.7 cm = volume: 2 mL. Normal
appearance/no adnexal mass.

Other findings:  No abnormal free fluid.
IMPRESSION: 1. Focal area of soft tissue in the endometrial canal with internal
vascularity measuring up to 2.2 cm. Given the patient's history,
this is most suggestive of retained products of conception.
2. Unremarkable appearance of the bilateral ovaries.
